# Patient Record
Sex: Male | Born: 1965
Health system: Southern US, Community
[De-identification: ages and names within clinical notes are randomized; demographics above are authoritative.]

## PROBLEM LIST (undated history)

## (undated) ENCOUNTER — Emergency Department: Payer: PRIVATE HEALTH INSURANCE

---

## 2012-10-19 LAB — HEMOGLOBIN A1C: Hemoglobin A1C: 6.3 % — ABNORMAL HIGH

## 2012-10-19 LAB — HEPATITIS C ANTIBODY: Hepatitis C Ab: NEGATIVE

## 2012-12-07 LAB — HM COLONOSCOPY

## 2014-09-01 LAB — LIPID PANEL
Chol/HDL Ratio: 2.7
Cholesterol: 172 mg/dL
HDL: 63 mg/dL
LDL Calculated: 92 mg/dL
Non HDL Cholesterol: 109 mg/dL
Triglycerides: 85 mg/dL

## 2016-04-26 ENCOUNTER — Other Ambulatory Visit: Payer: Self-pay | Admitting: Primary Care

## 2016-06-09 ENCOUNTER — Encounter: Payer: Self-pay | Admitting: Primary Care

## 2016-06-11 ENCOUNTER — Encounter: Payer: Self-pay | Admitting: Primary Care

## 2016-06-11 DIAGNOSIS — Z720 Tobacco use: Secondary | ICD-10-CM | POA: Insufficient documentation

## 2016-06-11 DIAGNOSIS — K900 Celiac disease: Secondary | ICD-10-CM

## 2016-06-11 DIAGNOSIS — E7800 Pure hypercholesterolemia, unspecified: Secondary | ICD-10-CM

## 2016-06-11 DIAGNOSIS — J45909 Unspecified asthma, uncomplicated: Secondary | ICD-10-CM | POA: Insufficient documentation

## 2016-06-11 DIAGNOSIS — J449 Chronic obstructive pulmonary disease, unspecified: Secondary | ICD-10-CM

## 2016-06-11 HISTORY — DX: Unspecified asthma, uncomplicated: J45.909

## 2016-06-11 HISTORY — DX: Celiac disease: K90.0

## 2016-06-11 HISTORY — DX: Pure hypercholesterolemia, unspecified: E78.00

## 2016-06-11 HISTORY — DX: Chronic obstructive pulmonary disease, unspecified: J44.9

## 2016-06-11 HISTORY — DX: Tobacco use: Z72.0

## 2016-09-03 ENCOUNTER — Ambulatory Visit (INDEPENDENT_AMBULATORY_CARE_PROVIDER_SITE_OTHER): Payer: Self-pay | Admitting: Family Medicine

## 2016-09-03 VITALS — BP 142/88 | HR 84 | Temp 97.9°F | Resp 18 | Ht 75.0 in | Wt 278.0 lb

## 2016-09-03 DIAGNOSIS — Z024 Encounter for examination for driving license: Secondary | ICD-10-CM

## 2016-09-03 NOTE — Patient Instructions (Addendum)
  Stop Allegra D as the decongestant may be increasing your blood pressure.Keep a record of your blood pressures outside of the office and if remaining over 140/90 - follow up with a primary care provider.   Avoid more than 1 alcoholic drink per day and if any cravings or times of drinking more - be seen by substance abuse specialist or discuss plan with primary care provider.  Do not drive if you have consumed ANY alcohol.   Recommend quitting smoking - call your primary provider for medication refill or follow up with local primary care provider.   Return to the clinic or go to the nearest emergency room if any of your symptoms worsen or new symptoms occur.    IF you received an x-ray today, you will receive an invoice from [facility]. Please contact [facility] at [phone] with questions or concerns regarding your invoice.   IF you received labwork today, you will receive an invoice from [facility]. Please contact [facility] at [phone] with questions or concerns regarding your invoice.   Our billing staff will not be able to assist you with questions regarding bills from these companies.  You will be contacted with the lab results as soon as they are available. The fastest way to get your results is to activate your My Chart account. Instructions are located on the last page of this paperwork. If you have not heard from us regarding the results in 2 weeks, please contact this office.

## 2016-09-03 NOTE — Progress Notes (Signed)
By signing my name below, I, [name], attest that this documentation has been prepared under the direction and in the presence of [name], MD.  Electronically Signed: [name], Medical Scribe. 09/03/16. 9:36 AM.  Subjective:    Patient ID: Wesley Edwards, male    DOB: 1965-10-22, 51 y.o.   MRN: [id number]  HPI Chief Complaint  Patient presents with  . Employment Physical    HPI Comments: Wesley Edwards is a 51 y.o. 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 No previous notes in system. Paper work reviewed, he has a hx of GERD, asthma, tobacco abuse, occasional alcohol.  Pt had his 2 year card before this visit.  Asthma: He takes symbicort, and albuterol PRN. Pt started taking allegra-D every morning in December for his dog allergies. Pt stays in a hotel and mentions it's dusty in the room - top of the counter had caked with dust. He no longer takes symbicort, and uses albuterol occasionally. No coughing fits causing dizziness. Denies chest pain, chest tightness, and SOB while driving.   GERD: He's in omeprezole. Pt would have GERD at night, but it's been controlled.  Vision: Pt does not wear rx glasses or contacts. He does wear cheaters to read, not to drive. Denies night time glare or trouble seeing from afar.  Tobacco Abuse: He smokes a pack a day and has thoughts of cessation - tried for a little over a year without success. When he tried chantix he quit for a year, but since everyone at his job smokes he began smoking again. Denies using cocaine, marijuana or other illicit drugs.  Alcohol Use: Pt was home for the holidays during his DWI -  He lives in [place] and will be here until December. Pt had his first and only a DWI 04/02/2015 when he was home on vacation. Pt did an impact panel for an 1.5 class, and another class in [place] for his DWI. Pt drinks occasionally and when he works he drinks 5 drinks a week - glass of wine  with dinner during the night. Pt tries not to drink while working since he's always on call when working. He had a few glasses of wine last night to celebrate the home coming of a friend, had 9-8 beers on Friday socially with his brother whose also a big drinker, and on special occasions. Denies problem/cravings with alcohol.  Denies history of sleep apnea, snoring or daytime somnolence.  Fractures: Pt had a fractured rib after falling on a log while hunting. 6-8 months later he tripped on a computer chair while walking in the dark.  There are no active problems to display for this patient.  History reviewed. No pertinent past medical history. History reviewed. No pertinent surgical history. Allergies  Allergen Reactions  . Sulfa Antibiotics Hives   Prior to Admission medications   Medication Sig Start Date End Date Taking? Authorizing Provider  budesonide-formoterol (SYMBICORT) 80-4.5 MCG/ACT inhaler Inhale 2 puffs into the lungs 2 (two) times daily.   Yes Historical Provider, MD   Social History   Social History  . Marital status: Single    Spouse name: N/A  . Number of children: N/A  . Years of education: N/A   Occupational History  . Not on file.   Social History Main Topics  . Smoking status: Current Every Day Smoker    Packs/day: 1.00    Types: Cigarettes  . Smokeless tobacco: Never Used  .  Alcohol use Yes  . Drug use: No  . Sexual activity: Not on file   Other Topics Concern  . Not on file   Social History Narrative  . No narrative on file   Review of Systems  Eyes: Negative for photophobia and visual disturbance.  Respiratory: Negative for cough, chest tightness and shortness of breath.   Cardiovascular: Negative for chest pain.  Neurological: Negative for dizziness and light-headedness.   Objective:  Physical Exam  Constitutional: He is oriented to person, place, and time. He appears well-developed and well-nourished.  HENT:  Head: Normocephalic and  atraumatic.  Right Ear: External ear normal.  Left Ear: External ear normal.  Mouth/Throat: Oropharynx is clear and moist.  Eyes: Conjunctivae and EOM are normal. Pupils are equal, round, and reactive to light.  Neck: Normal range of motion. Neck supple. No thyromegaly present.  Cardiovascular: Normal rate, regular rhythm, normal heart sounds and intact distal pulses.   Pulmonary/Chest: Effort normal and breath sounds normal. No respiratory distress. He has no wheezes.  Abdominal: Soft. He exhibits no distension. There is no tenderness. Hernia confirmed negative in the right inguinal area and confirmed negative in the left inguinal area.  Musculoskeletal: Normal range of motion. He exhibits no edema or tenderness.  Lymphadenopathy:    He has no cervical adenopathy.  Neurological: He is alert and oriented to person, place, and time. He has normal reflexes.  Skin: Skin is warm and dry.  Cool toes with socks off, but cap refill and sensation intact.   Psychiatric: He has a normal mood and affect. His behavior is normal.  Vitals reviewed.   Vitals:   09/03/16 0856 09/03/16 0921  BP: (!) 160/92 (!) 142/88  Pulse: 84   Resp: 18   Temp: 97.9 F (36.6 C)   TempSrc: Oral   SpO2: 95%   Weight: 278 lb (126.1 kg)   Height: 6\' 3"  (1.905 m)    Vision/Hearing:  Visual Acuity Screening   Right eye Left eye Both eyes  Without correction: 20/13 20/13 20/13   With correction:     Comments: Titmus test was 85 degrees in left eye and 70 degrees in right eye.  Hearing Screening Comments: WHISPER TEST WAS 10FT IN BOTH EARS.  Assessment & Plan:   Wesley HeroRobert Lie is a 51 y.o. male Encounter for commercial driver medical examination (CDME)  -One year card given for elevated blood pressure, but that may be related to decongestant use. Monitor blood pressure out of office and follow-up with primary care provider if remaining over 140/90.   -Also discussed avoid alcohol more than 1 drink per day. Denies  problem drinking. If any increased cravings or use, to meet with primary care provider or substance abuse counselor and not to drive. Should not drive if consumed any alcohol.   -Tobacco cessation discussed, and can follow-up with primary care provider or call his previous primary care provider for refill of Chantix if needed.  - see dot ppwk.  Meds ordered this encounter  Medications  . budesonide-formoterol (SYMBICORT) 80-4.5 MCG/ACT inhaler    Sig: Inhale 2 puffs into the lungs 2 (two) times daily.   Patient Instructions    Stop Allegra D as the decongestant may be increasing your blood pressure.Keep a record of your blood pressures outside of the office and if remaining over 140/90 - follow up with a primary care provider.   Avoid more than 1 alcoholic drink per day and if any cravings or times of drinking more -  be seen by substance abuse specialist or discuss plan with primary care provider.  Do not drive if you have consumed ANY alcohol.   Recommend quitting smoking - call your primary provider for medication refill or follow up with local primary care provider.   Return to the clinic or go to the nearest emergency room if any of your symptoms worsen or new symptoms occur.    IF you received an x-ray today, you will receive an invoice from [facility]. Please contact [facility] at [phone] with questions or concerns regarding your invoice.   IF you received labwork today, you will receive an invoice from [facility]. Please contact [facility] at [phone] with questions or concerns regarding your invoice.   Our billing staff will not be able to assist you with questions regarding bills from these companies.  You will be contacted with the lab results as soon as they are available. The fastest way to get your results is to activate your My Chart account. Instructions are located on the last page of this paperwork. If you have not heard from [place] regarding the  results in 2 weeks, please contact this office.      I personally performed the services described in this documentation, which was scribed in my presence. The recorded information has been reviewed and considered for accuracy and completeness, addended by me as needed, and agree with information above.  Signed,   [name], MD Primary Care at [facility].  09/03/16 10:04 AM

## 2016-10-03 ENCOUNTER — Ambulatory Visit: Payer: Self-pay | Admitting: Family Medicine

## 2017-03-25 ENCOUNTER — Ambulatory Visit: Payer: PRIVATE HEALTH INSURANCE | Attending: Primary Care | Admitting: Primary Care

## 2017-03-25 VITALS — BP 130/90 | HR 83 | Temp 97.7°F | Ht 76.0 in | Wt 261.0 lb

## 2017-03-25 DIAGNOSIS — L231 Allergic contact dermatitis due to adhesives: Secondary | ICD-10-CM

## 2017-03-25 DIAGNOSIS — T22222A Burn of second degree of left elbow, initial encounter: Secondary | ICD-10-CM

## 2017-03-25 MED ORDER — PREDNISONE 20 MG PO TABS *I*
20.0000 mg | ORAL_TABLET | Freq: Two times a day (BID) | ORAL | 0 refills | Status: DC
Start: 2017-03-25 — End: 2017-04-03

## 2017-03-25 MED ORDER — MUPIROCIN 2 % EX OINT *I*
TOPICAL_OINTMENT | Freq: Three times a day (TID) | CUTANEOUS | 1 refills | Status: DC
Start: 2017-03-25 — End: 2017-12-09

## 2017-03-25 NOTE — Progress Notes (Signed)
Kevin SailsRobert J Baldwin is a 51 y.o. male is here today for   Chief Complaint   Patient presents with    Burn     left arm from cookie sheet,   right leg from canned air       HPI: About a week ago he burned the left arm as above on a cookie sheet.  He also had sprayed a cleaner inadvertently on his right leg separate from that event.  Over the last days gotten a large itching rash around the area on the left antecubital fossa where the burn is.  He wondered if it could be infection.      ROS initially treated with dermoplasty spray and then only today did he use some Neosporin.  That is a known allergy on his list.    Patient Active Problem List   Diagnosis Code    Asthma without status asthmaticus J45.909    Tobacco user Z72.0    Pure hypercholesterolemia E78.00    Chronic obstructive lung disease J44.9    Celiac disease K90.0       Allergies   Allergen Reactions    Bactrim [Sulfamethoxazole-Trimethoprim] Hives    Polymyxin B Other (See Comments)     Reaction not specified        Current Outpatient Prescriptions   Medication    omeprazole (PRILOSEC) 20 MG capsule    aspirin 325 MG tablet    VENTOLIN HFA 108 (90 BASE) MCG/ACT inhaler    mupirocin (BACTROBAN) 2 % ointment    predniSONE (DELTASONE) 20 MG tablet    budesonide-formoterol (SYMBICORT) 80-4.5 MCG/ACT inhaler     No current facility-administered medications for this visit.          Vitals:    03/25/17 1613   BP: 130/90   Pulse: 83   Temp: 36.5 C (97.7 F)   Weight: 118.4 kg (261 lb)   Height: 1.93 m (6\' 4" )     Physical Exam  Looks like a contact dermatitis around his burn.  The burn is 4 cm by a centimeter left antecubital.  The right leg blistering is about 3 cm in diameter.  The area of somewhat firm superficial redness and blistering without tenderness in the left antecubital fossa is about 10 x 25 cm.  See photograph.          Assesment/Plan:   Contact dermatitis presumably from thermoplastic spray if not then Neosporin and a second and perhaps  even third-degree burn.  Recommended mupirocin ointment to both burns rather than to Reclast or Neosporin.  Consider antibiotic if the red areas become more painful or if he gets a fever.    No Follow-up on file.  Ronni RumbleANIEL L Chera Slivka, MD

## 2017-04-03 ENCOUNTER — Ambulatory Visit: Payer: BLUE CROSS/BLUE SHIELD | Attending: Family Medicine | Admitting: Family Medicine

## 2017-04-03 ENCOUNTER — Encounter: Payer: Self-pay | Admitting: Family Medicine

## 2017-04-03 VITALS — BP 142/90 | HR 80 | Temp 97.9°F | Ht 76.0 in | Wt 261.0 lb

## 2017-04-03 DIAGNOSIS — R21 Rash and other nonspecific skin eruption: Secondary | ICD-10-CM | POA: Insufficient documentation

## 2017-04-03 LAB — GRAM STAIN: Gram Stain: 0

## 2017-04-03 MED ORDER — DOXYCYCLINE HYCLATE 100 MG PO TABS *I*
100.0000 mg | ORAL_TABLET | Freq: Two times a day (BID) | ORAL | 0 refills | Status: AC
Start: 2017-04-03 — End: 2017-04-13

## 2017-04-03 NOTE — Progress Notes (Signed)
Kevin SailsRobert J Burgert is a 51 y.o. male is here today for   Chief Complaint   Patient presents with    Follow-up     left arm burn & med reaction        Medications reviewed today in Epic.     Allergies were reviewed and confirmed with patient.       No past medical history on file.     Current Outpatient Prescriptions on File Prior to Visit   Medication Sig Dispense Refill    omeprazole (PRILOSEC) 20 MG capsule Take 20 mg by mouth daily (before breakfast)      mupirocin (BACTROBAN) 2 % ointment Apply topically 3 times daily 30 g 1    aspirin 325 MG tablet Take 325 mg by mouth daily      budesonide-formoterol (SYMBICORT) 80-4.5 MCG/ACT inhaler Inhale 2 puffs into the lungs 2 times daily   Shake well before each use.      VENTOLIN HFA 108 (90 BASE) MCG/ACT inhaler INHALE TWO PUFFS BY MOUTH EVERY 4 HOURS AS NEEDED FOR WHEEZING AND DIFFICULTY BREATHING 1 Inhaler 0     No current facility-administered medications on file prior to visit.         Allergies   Allergen Reactions    Bactrim [Sulfamethoxazole-Trimethoprim] Hives    Polymyxin B Other (See Comments)     Reaction not specified          HPI: He is back with worsening of the rash he had on his left inner arm. The rash is now spreading up and down the arm from the elbow area where it started. Red bumps have appeared, which are what is spreading, the center of the rash is oozing yellow fluid constantly which dries and turns crusty and very sticky. Prednisone did not improve it at all, just kept him awake and eating all night. He's been putting bacitracin ointment on it and it feels good for a minute, and then goes right back to oozing and sticking and feeling irritated.       ROS    Vitals:    04/03/17 1216   BP: 142/90   Pulse: 80   Temp: 36.6 C (97.9 F)   Weight: 118.4 kg (261 lb)   Height: 1.93 m (6\' 4" )         Health Maintenance Due   Topic Date Due    DEPRESSION SCREEN YEARLY  10/13/1965    HIV TESTING OFFERED  10/08/1980    IMM-INFLUENZA (1) 03/22/2017        Physical Exam  He looks well, but the right arm is red from the deltoid nearly to the wrist and swollen, oozing yellow fluid and with red bumps spreading past the oozing red rash to the tip of the shoulder and down to the wrist.     Assesment/Plan:   I would seem the present treatment is making things worse. I swabbed the wound for culture, the original burn area appears to be well healed, with the surrounding tissue further inflamed. Doxycyline ordered while we await the cutlure results. Stop using the bacitracin ointment and just use a soak to help clear the drainage until it starts to clear up. This drainage could be infectious, so try not to leave it around. He will call if anything seems worse rather than better.       No Follow-up on file.

## 2017-04-05 LAB — AEROBIC CULTURE: Aerobic Culture: 0

## 2017-04-07 ENCOUNTER — Telehealth: Payer: Self-pay | Admitting: Primary Care

## 2017-04-07 DIAGNOSIS — L239 Allergic contact dermatitis, unspecified cause: Secondary | ICD-10-CM

## 2017-04-07 NOTE — Telephone Encounter (Signed)
Left message to call back  

## 2017-04-07 NOTE — Telephone Encounter (Signed)
-----   Message from Mia Creek, FNP sent at 04/05/2017 10:51 AM EDT -----  Please call to report that there were a few different kinds of bacteria on the culture, hopefully the rash is improving on the antibiotic.

## 2017-04-08 MED ORDER — BETAMETHASONE VALERATE 0.1 % EX CREA *I*
TOPICAL_CREAM | Freq: Two times a day (BID) | CUTANEOUS | 3 refills | Status: DC | PRN
Start: 2017-04-08 — End: 2017-12-09

## 2017-04-08 NOTE — Telephone Encounter (Signed)
He can use betamethasone on it.  Prescription sent.

## 2017-04-08 NOTE — Telephone Encounter (Signed)
Patient called and notified. He states the rash is drying up, it is still very red but the whole arm around the rash is white dry skin. It still is itchy and burning feeling. He is only taking the antibiotics no creams or anything else. Any suggestions?

## 2017-04-08 NOTE — Addendum Note (Signed)
Addended by: Ronni Rumble on: 04/08/2017 09:35 AM     Modules accepted: Orders

## 2017-04-08 NOTE — Telephone Encounter (Signed)
Detailed message left that script was sent to pharmacy and to call back if this does not help his symptoms.

## 2017-10-28 ENCOUNTER — Emergency Department (HOSPITAL_BASED_OUTPATIENT_CLINIC_OR_DEPARTMENT_OTHER)
Admission: EM | Admit: 2017-10-28 | Discharge: 2017-10-28 | Disposition: A | Discharge status: Home / Self Care | Payer: BLUE CROSS/BLUE SHIELD | Attending: Emergency Medicine | Admitting: Emergency Medicine

## 2017-10-28 ENCOUNTER — Emergency Department (HOSPITAL_BASED_OUTPATIENT_CLINIC_OR_DEPARTMENT_OTHER): Payer: BLUE CROSS/BLUE SHIELD

## 2017-10-28 ENCOUNTER — Other Ambulatory Visit: Payer: Self-pay

## 2017-10-28 ENCOUNTER — Encounter (HOSPITAL_BASED_OUTPATIENT_CLINIC_OR_DEPARTMENT_OTHER): Payer: Self-pay | Admitting: *Deleted

## 2017-10-28 DIAGNOSIS — H538 Other visual disturbances: Secondary | ICD-10-CM

## 2017-10-28 DIAGNOSIS — R42 Dizziness and giddiness: Secondary | ICD-10-CM | POA: Insufficient documentation

## 2017-10-28 DIAGNOSIS — R358 Other polyuria: Secondary | ICD-10-CM | POA: Diagnosis not present

## 2017-10-28 DIAGNOSIS — F17210 Nicotine dependence, cigarettes, uncomplicated: Secondary | ICD-10-CM | POA: Insufficient documentation

## 2017-10-28 DIAGNOSIS — R51 Headache: Secondary | ICD-10-CM | POA: Diagnosis not present

## 2017-10-28 DIAGNOSIS — R631 Polydipsia: Secondary | ICD-10-CM | POA: Diagnosis not present

## 2017-10-28 DIAGNOSIS — R739 Hyperglycemia, unspecified: Secondary | ICD-10-CM | POA: Insufficient documentation

## 2017-10-28 LAB — URINALYSIS, ROUTINE W REFLEX MICROSCOPIC
GLUCOSE, UA: 250 mg/dL — AB
HGB URINE DIPSTICK: NEGATIVE
Ketones, ur: NEGATIVE mg/dL
Nitrite: NEGATIVE
PH: 6.5 (ref 5.0–8.0)
Protein, ur: 30 mg/dL — AB
SPECIFIC GRAVITY, URINE: 1.02 (ref 1.005–1.030)

## 2017-10-28 LAB — URINALYSIS, MICROSCOPIC (REFLEX)

## 2017-10-28 LAB — COMPREHENSIVE METABOLIC PANEL
ALBUMIN: 4.2 g/dL (ref 3.5–5.0)
ALT: 112 U/L — ABNORMAL HIGH (ref 17–63)
ANION GAP: 10 (ref 5–15)
AST: 78 U/L — ABNORMAL HIGH (ref 15–41)
Alkaline Phosphatase: 93 U/L (ref 38–126)
BUN: 10 mg/dL (ref 6–20)
CO2: 22 mmol/L (ref 22–32)
Calcium: 9.3 mg/dL (ref 8.9–10.3)
Chloride: 102 mmol/L (ref 101–111)
Creatinine, Ser: 0.76 mg/dL (ref 0.61–1.24)
GFR calc non Af Amer: >60 mL/min (ref >60)
GLUCOSE: 208 mg/dL — AB (ref 65–99)
POTASSIUM: 4 mmol/L (ref 3.5–5.1)
SODIUM: 134 mmol/L — AB (ref 135–145)
TOTAL PROTEIN: 7.2 g/dL (ref 6.5–8.1)
Total Bilirubin: 0.7 mg/dL (ref 0.3–1.2)

## 2017-10-28 LAB — CBG MONITORING, ED: GLUCOSE-CAPILLARY: 226 mg/dL — AB (ref 65–99)

## 2017-10-28 LAB — CBC
HEMATOCRIT: 46.1 % (ref 39.0–52.0)
HEMOGLOBIN: 16.5 g/dL (ref 13.0–17.0)
MCH: 31.7 pg (ref 26.0–34.0)
MCHC: 35.8 g/dL (ref 30.0–36.0)
MCV: 88.5 fL (ref 78.0–100.0)
Platelets: 171 10*3/uL (ref 150–400)
RBC: 5.21 MIL/uL (ref 4.22–5.81)
RDW: 12.7 % (ref 11.5–15.5)
WBC: 7.5 10*3/uL (ref 4.0–10.5)

## 2017-10-28 MED ORDER — METFORMIN HCL 500 MG PO TABS
500.0000 mg | ORAL_TABLET | Freq: Once | ORAL | Status: AC
  Administered 2017-10-28: 500 mg via ORAL
  Filled 2017-10-28: qty 1

## 2017-10-28 MED ORDER — METFORMIN HCL 500 MG PO TABS
500.0000 mg | ORAL_TABLET | Freq: Two times a day (BID) | ORAL | 0 refills | Status: AC
Start: 2017-10-28 — End: ?

## 2017-10-28 MED ORDER — SODIUM CHLORIDE 0.9 % IV BOLUS
1000.0000 mL | Freq: Once | INTRAVENOUS | Status: AC
  Administered 2017-10-28: 1000 mL via INTRAVENOUS

## 2017-10-28 MED FILL — metFORMIN HCL 500 MG TABS: 500 | 30 days supply | Qty: 60 | Fill #0

## 2017-10-28 NOTE — ED Notes (Signed)
ED Provider at bedside. 

## 2017-10-28 NOTE — ED Provider Notes (Signed)
[facility] EMERGENCY DEPARTMENT Provider Note   CSN: [id number] Arrival date & time: 10/28/17  1209     History   Chief Complaint Chief Complaint  Patient presents with  . Hyperglycemia    HPI Wesley Edwards is a 52 y.o. male here presenting with dizziness, vision changes, increased thirst.  [place] been [place] here for the last several months.  He states that over the last 3-4 weeks, he has intermittent blurry vision and some posterior headaches.  He also has increased thirstiness as well as dizziness and increased urination.  Denies any fevers or chills or vomiting or abdominal pain.  Patient has no history of diabetes but used his friend's glucometer and his sugar was 430 prior to arrival.   The history is provided by the patient.    History reviewed. No pertinent past medical history.  There are no active problems to display for this patient.   History reviewed. No pertinent surgical history.      Home Medications    Prior to Admission medications   Medication Sig Start Date End Date Taking? Authorizing Provider  budesonide-formoterol (SYMBICORT) 80-4.5 MCG/ACT inhaler Inhale 2 puffs into the lungs 2 (two) times daily.    [provider]    Family History No family history on file.  Social History Social History   Tobacco Use  . Smoking status: Current Every Day Smoker    Packs/day: 1.00    Types: Cigarettes  . Smokeless tobacco: Never Used  Substance Use Topics  . Alcohol use: Yes  . Drug use: No     Allergies   Sulfa antibiotics   Review of Systems Review of Systems  Constitutional: Positive for fatigue.  Eyes: Positive for visual disturbance.  Endocrine: Positive for polydipsia and polyuria.  Neurological: Positive for dizziness and headaches.  All other systems reviewed and are negative.    Physical Exam Updated Vital Signs BP (!) 140/105 (BP Location: Left Arm)   Pulse 78    Temp 98.2 F (36.8 C) (Oral)   Resp 14   Ht 6\' 4"  (1.93 m)   Wt 120.7 kg (266 lb 1.5 oz)   SpO2 96%   BMI 32.39 kg/m   Physical Exam  Constitutional: He is oriented to person, place, and time. He appears well-developed and well-nourished.  HENT:  Head: Normocephalic.  Mouth/Throat: Oropharynx is clear and moist.  Eyes: Pupils are equal, round, and reactive to light. EOM are normal.  No obvious papilledema bilaterally   Neck: Normal range of motion.  Cardiovascular: Normal rate, regular rhythm and normal heart sounds.  Pulmonary/Chest: Effort normal and breath sounds normal. No stridor. No respiratory distress. He has no wheezes.  Abdominal: Soft. Bowel sounds are normal. He exhibits no distension. There is no tenderness.  Musculoskeletal: Normal range of motion.  Neurological: He is alert and oriented to person, place, and time. No cranial nerve deficit. Coordination normal.  Skin: Skin is warm.  Psychiatric: He has a normal mood and affect.  Nursing note and vitals reviewed.    ED Treatments / Results  Labs (all labs ordered are listed, but only abnormal results are displayed) Labs Reviewed  URINALYSIS, ROUTINE W REFLEX MICROSCOPIC - Abnormal; Notable for the following components:      Result Value   Glucose, UA 250 (*)    Bilirubin Urine SMALL (*)    Protein, ur 30 (*)    Leukocytes, UA TRACE (*)  All other components within normal limits  URINALYSIS, MICROSCOPIC (REFLEX) - Abnormal; Notable for the following components:   Bacteria, UA FEW (*)    Squamous Epithelial / LPF 0-5 (*)    All other components within normal limits  COMPREHENSIVE METABOLIC PANEL - Abnormal; Notable for the following components:   Sodium 134 (*)    Glucose, Bld 208 (*)    AST 78 (*)    ALT 112 (*)    All other components within normal limits  CBG MONITORING, ED - Abnormal; Notable for the following components:   Glucose-Capillary 226 (*)    All other components within normal limits    CBC  CBG MONITORING, ED    EKG None  Radiology Ct Head Wo Contrast  Result Date: 10/28/2017 CLINICAL DATA:  Fatigue and dizziness EXAM: CT HEAD WITHOUT CONTRAST TECHNIQUE: Contiguous axial images were obtained from the base of the skull through the vertex without intravenous contrast. COMPARISON:  None. FINDINGS: Brain: No mass lesion, intraparenchymal hemorrhage or extra-axial collection. No evidence of acute cortical infarct. Normal appearance of the brain parenchyma and extra axial spaces for age. Vascular: No hyperdense vessel or unexpected vascular calcification. Skull: Normal visualized skull base, calvarium and extracranial soft tissues. Sinuses/Orbits: No sinus fluid levels or advanced mucosal thickening. No mastoid effusion. Normal orbits. IMPRESSION: Normal head CT. Electronically Signed   By: Deatra Robinson M.D.   On: 10/28/2017 14:27    Procedures Procedures (including critical care time)  Medications Ordered in ED Medications  metFORMIN (GLUCOPHAGE) tablet 500 mg (has no administration in time range)  sodium chloride 0.9 % bolus 1,000 mL (1,000 mLs Intravenous New Bag/Given 10/28/17 1335)     Initial Impression / Assessment and Plan / ED Course  I have reviewed the triage vital signs and the nursing notes.  Pertinent labs & imaging results that were available during my care of the patient were reviewed by me and considered in my medical decision making (see chart for details).     Wesley Edwards is a 52 y.o. male here with hyperglycemia, blurry vision, increased thirst. Likely new onset type 2 diabetes. Posterior headache and blurry vision somewhat atypical so will get CT head to r/o underlying mass. Will get labs and hydrate and likely will need to start on metformin. No vomiting or fevers or suggest DKA.    2:32 PM CT head unremarkable. Glucose 208, nl AG. LFTs slightly elevated but no RUQ tenderness. Likely new onset type 2 diabetes. Will start on metformin 500 mg BID.  Will have him follow up with PCP back home or at [facility] center here.   Final Clinical Impressions(s) / ED Diagnoses   Final diagnoses:  None    ED Discharge Orders    None       Charlynne Pander, MD 10/28/17 1433

## 2017-10-28 NOTE — ED Triage Notes (Signed)
Blurred vision, thirst, fatigue, dizziness for a few weeks. A friend checked his glucose and it was 430. No hx of diabetes.

## 2017-10-28 NOTE — ED Notes (Signed)
Pt. Reports he has had some eye issues with vision changes and thirst.

## 2017-10-28 NOTE — Discharge Instructions (Addendum)
Take metformin twice daily.   You likely have new onset diabetes. Please see wellness center for follow up or see your doctor at home   Return to ER if you have worse blurry vision, headaches, vomiting, fever, abdominal pain.

## 2017-12-09 ENCOUNTER — Ambulatory Visit: Payer: BLUE CROSS/BLUE SHIELD | Attending: Family Medicine | Admitting: Family Medicine

## 2017-12-09 ENCOUNTER — Encounter: Payer: Self-pay | Admitting: Family Medicine

## 2017-12-09 ENCOUNTER — Telehealth: Payer: Self-pay | Admitting: Primary Care

## 2017-12-09 VITALS — BP 146/92 | HR 86 | Temp 97.9°F | Ht 76.25 in | Wt 267.1 lb

## 2017-12-09 DIAGNOSIS — E7800 Pure hypercholesterolemia, unspecified: Secondary | ICD-10-CM | POA: Insufficient documentation

## 2017-12-09 DIAGNOSIS — E119 Type 2 diabetes mellitus without complications: Secondary | ICD-10-CM

## 2017-12-09 DIAGNOSIS — Z0000 Encounter for general adult medical examination without abnormal findings: Secondary | ICD-10-CM | POA: Insufficient documentation

## 2017-12-09 DIAGNOSIS — I10 Essential (primary) hypertension: Secondary | ICD-10-CM

## 2017-12-09 DIAGNOSIS — R5383 Other fatigue: Secondary | ICD-10-CM | POA: Insufficient documentation

## 2017-12-09 DIAGNOSIS — R0683 Snoring: Secondary | ICD-10-CM | POA: Insufficient documentation

## 2017-12-09 HISTORY — DX: Essential (primary) hypertension: I10

## 2017-12-09 HISTORY — DX: Type 2 diabetes mellitus without complications: E11.9

## 2017-12-09 LAB — POCT URINALYSIS DIPSTICK
Bilirubin,Ur: NEGATIVE
Blood,UA POCT: NEGATIVE
Glucose,UA POCT: 100 mg/dL
Ketones,UA POCT: NEGATIVE mg/dL
Leuk Esterase,UA POCT: NEGATIVE
Lot #: 32222103
Nitrite,UA POCT: NEGATIVE
PH,UA POCT: 5 (ref 5–8)
Protein,UA POCT: NEGATIVE mg/dL
Specific gravity,UA POCT: 1.02 (ref 1.002–1.030)
Urobilinogen,UA: NORMAL mg/dL

## 2017-12-09 LAB — COMPREHENSIVE METABOLIC PANEL
ALT: 96 U/L — ABNORMAL HIGH (ref 0–50)
AST: 67 U/L — ABNORMAL HIGH (ref 0–50)
Albumin: 4.7 g/dL (ref 3.5–5.2)
Alk Phos: 98 U/L (ref 40–130)
Anion Gap: 14 (ref 7–16)
Bilirubin,Total: 0.5 mg/dL (ref 0.0–1.2)
CO2: 26 mmol/L (ref 20–28)
Calcium: 9.5 mg/dL (ref 8.6–10.2)
Chloride: 101 mmol/L (ref 96–108)
Creatinine: 0.8 mg/dL (ref 0.67–1.17)
GFR,Black: 120 *
GFR,Caucasian: 104 *
Glucose: 168 mg/dL — ABNORMAL HIGH (ref 60–99)
Lab: 9 mg/dL (ref 6–20)
Potassium: 5 mmol/L (ref 3.3–5.1)
Sodium: 141 mmol/L (ref 133–145)
Total Protein: 7.1 g/dL (ref 6.3–7.7)

## 2017-12-09 LAB — LIPID PANEL
Chol/HDL Ratio: 3.4
Cholesterol: 229 mg/dL — AB
HDL: 68 mg/dL
LDL Calculated: 138 mg/dL — AB
Non HDL Cholesterol: 161 mg/dL
Triglycerides: 115 mg/dL

## 2017-12-09 LAB — TSH: TSH: 0.94 u[IU]/mL (ref 0.27–4.20)

## 2017-12-09 MED ORDER — LISINOPRIL 10 MG PO TABS *I*
10.0000 mg | ORAL_TABLET | Freq: Every day | ORAL | 5 refills | Status: DC
Start: 2017-12-09 — End: 2018-07-23

## 2017-12-09 NOTE — Telephone Encounter (Signed)
Order, face sheet and office note printed and given to [facility].

## 2017-12-09 NOTE — Progress Notes (Signed)
Kevin Baldwin is a 52 y.o. male is here today for   Chief Complaint   Patient presents with    Annual Exam     blood work, physical       Medications were reviewed today in Epic.    Allergies were reviewed and confirmed with patient.       History reviewed. No pertinent past medical history.     Current Outpatient Prescriptions on File Prior to Visit   Medication Sig Dispense Refill    omeprazole (PRILOSEC) 20 MG capsule Take 20 mg by mouth daily (before breakfast)      aspirin 325 MG tablet Take 325 mg by mouth daily      VENTOLIN HFA 108 (90 BASE) MCG/ACT inhaler INHALE TWO PUFFS BY MOUTH EVERY 4 HOURS AS NEEDED FOR WHEEZING AND DIFFICULTY BREATHING 1 Inhaler 0    budesonide-formoterol (SYMBICORT) 80-4.5 MCG/ACT inhaler Inhale 2 puffs into the lungs 2 times daily   Shake well before each use.       No current facility-administered medications on file prior to visit.         Allergies   Allergen Reactions    Bactrim [Sulfamethoxazole-Trimethoprim] Hives    Polymyxin B Other (See Comments)     Reaction not specified          HPI:  He was working down in [employer] about 6 weeks ago, and got dizzy and blurred vision when on the job. He went to an ER down there and his blood sugar was found to be quite high, in the high 300s and even 400+. He was given metformin 500 mg to take twice a day with meals. He has been pretty good about taking that in the mornings, but has forgotten the evening dose a few times. He bought himself a glucose tester at [employer], and has been checking he BG in the mornings. He is running around 150-160 on average now. He admits to feeling very tired all the time, and his wife is concerned he has sleep apnea. He snores loudly and stops breathing in his sleep. He has a history of elevated cholesterol levels, but isn't taking any medicine for that. He denies any chest pains or shortness of breath, but he does have asthma and he smokes.     Family history is updated today, positive for DM,  heart disease, hypertension, and high cholesterol.     ROS As in HPI    Vitals:    12/09/17 0756   BP: (!) 146/92   Pulse: 86   Temp: 36.6 C (97.9 F)   Weight: 121.2 kg (267 lb 2 oz)   Height: 1.937 m (6' 4.25")         Health Maintenance Due   Topic Date Due    HIV TESTING OFFERED  10/08/1980    IMM-ZOSTER (1 of 2) 10/08/2017    COLON CANCER SCREENING 5 YEAR COLONOSCOPY  12/07/2017       Physical Exam  He appears well, but his BP was 166/117 when he first arrived. That did come down on serial BPs to 146/92 average. Other vital signs as above. BMI is 32.3. Lungs have some scattered wheezes and crackles that do clear with a deep breath and cough.  He is a smoker.  Heart RRR, no murmurs or extrasystoles. Abdomen is soft and without tenderness, organomegaly or masses.     Assesment/Plan:  1. Type 2 diabetes mellitus  - continue Metformin 500 mg bid with meals  -  EKG 12 lead  - Hemoglobin A1c  - Comprehensive metabolic panel  - Continue the metformin 500 mg twice a day, and consider switching to a long-acting form so that he is not missing the evening dose.  - Plan follow-up in 3 months    2. Pure hypercholesterolemia  - EKG 12 lead  - Lipid add Rfx to Drt LDL if Trig >400  - Comprehensive metabolic panel    3. Loud snoring  - Sleep Study; Future    4. Fatigue  - Sleep Study; Future  - TSH  - Testosterone, free, total    5. Hypertension  - lisinopril (PRINIVIL,ZESTRIL) 10 MG tablet; Take 1 tablet (10 mg total) by mouth daily  Dispense: 30 tablet; Refill: 5  - He has an upcoming appointment for a DOT physical and is aware that his blood pressure needs to be under control before he can pass the test.    Return in about 1 month (around 01/09/2018).

## 2017-12-10 LAB — HEMOGLOBIN A1C: Hemoglobin A1C: 9 % — ABNORMAL HIGH

## 2017-12-10 LAB — TESTOSTERONE, FREE, TOTAL
Testosterone,% Free: 1 %
Testosterone,Free: 73 pg/mL (ref 47–244)
Testosterone: 496 ng/dL (ref 193–740)

## 2017-12-10 LAB — SEX HORMONE BINDING GLOBULIN: Sex Hormone Binding Glob: 53 nmol/L (ref 10–80)

## 2017-12-10 NOTE — Telephone Encounter (Signed)
Lm to call Teresa Price [phone]

## 2017-12-11 NOTE — Telephone Encounter (Signed)
Patient called and said he would like Barb S to call him back on his cell# when you get a chance

## 2017-12-12 ENCOUNTER — Encounter: Payer: Self-pay | Admitting: Gastroenterology

## 2017-12-12 ENCOUNTER — Telehealth: Payer: Self-pay

## 2017-12-12 NOTE — Telephone Encounter (Signed)
-----   Message from Mia Creek, FNP sent at 12/12/2017  7:09 AM EDT -----  Please call to report labs results are not too bad, diabetes number is 9.0, but I suspect it will be even better next time. Stay on the metformin and watch the diet. Testosterone levels are good. Thyroid function is normal. Cholesterol will be better next time as well, but not too bad now. Liver enzymes slightly elevated, may be due to the alcohol, but he's cut that down already as well. Plan f/u in 3 months for more blood work.

## 2017-12-12 NOTE — Telephone Encounter (Signed)
He will get machine today

## 2017-12-12 NOTE — Telephone Encounter (Signed)
notified

## 2017-12-16 ENCOUNTER — Other Ambulatory Visit: Payer: Self-pay

## 2017-12-16 LAB — CBC
Baso # K/uL: 0.1 10*3/uL — ABNORMAL HIGH (ref 0.02–0.05)
Basophil %: 0.7 % (ref 0.0–2.0)
Eos # K/uL: 0.1 10*3/uL — ABNORMAL LOW (ref 0.15–0.70)
Eosinophil %: 0.9 % — ABNORMAL LOW (ref 1.0–7.0)
Hematocrit: 47.8 % (ref 41.0–49.0)
Hemoglobin: 16.3 g/dL (ref 14.0–16.7)
Lymph # K/uL: 0.8 10*3/uL — ABNORMAL LOW (ref 1.20–4.40)
Lymphocyte %: 9.4 % — ABNORMAL LOW (ref 20.0–45.0)
MCH: 31.9 pg/cell (ref 28.0–33.0)
MCHC: 34 g/dL (ref 31.0–35.0)
MCV: 93.7 fL (ref 80.0–95.0)
Mean Platelet Volume: 8.9 fL (ref 7.2–10.2)
Mono # K/uL: 0.2 10*3/uL (ref 0.11–0.80)
Monocyte %: 2.7 % (ref 1.0–10.0)
Neut # K/uL: 7 10*3/uL (ref 1.40–7.50)
Platelets: 181 10*3/uL (ref 130–400)
RBC: 5.1 M/uL (ref 4.60–6.10)
RDW: 13 % (ref 11.0–16.0)
Seg Neut %: 86.3 % — ABNORMAL HIGH (ref 40.0–75.0)
WBC: 8.1 10*3/uL (ref 4.8–10.8)

## 2017-12-16 LAB — COMPREHENSIVE METABOLIC PANEL
A/G RATIO: 1.3 (ref 1.1–2.3)
ALT: 84 IU/L — ABNORMAL HIGH (ref 10–60)
AST: 61 IU/L — ABNORMAL HIGH (ref 10–42)
Albumin: 4.3 g/dL (ref 3.5–5.0)
Alk Phos: 77 IU/L (ref 42–121)
Anion Gap: 12 mmol/L (ref 7–16)
Bilirubin,Total: 0.9 mg/dL (ref 0.2–1.0)
CO2: 22 mmol/L (ref 21–31)
Calcium: 9.3 mg/dL (ref 8.4–10.2)
Chloride: 103 mmol/L (ref 101–111)
Creatinine: 0.81 mg/dL (ref 0.61–1.24)
GFR,Black: 122 mL/min (ref 60–130)
GFR,Caucasian: 101 (ref 60–130)
Glucose: 196 mg/dL — ABNORMAL HIGH (ref 70–100)
Lab: 11 mg/dL (ref 6–20)
Osmolality: 278.6 mosm/L (ref 275–305)
Potassium: 4.3 mmol/L (ref 3.6–5.0)
Sodium: 137 mmol/L (ref 135–145)
Total Protein: 7.6 g/dL (ref 6.7–8.2)
UN/Creat Ratio: 13.6 (ref 12.0–20.0)

## 2017-12-16 NOTE — Telephone Encounter (Signed)
Snap machine returned. Paperwork sent to scanning. He has developed a golden crust sore areas under his nose and lower lip. Offered him an  appt today at 1:40 and 4pm , he dropped of machine at noon.He refused to wait. He will just go to ER

## 2017-12-17 ENCOUNTER — Telehealth: Payer: Self-pay | Admitting: Primary Care

## 2017-12-17 NOTE — Telephone Encounter (Signed)
FYI:  Patient has been admitted to [facility] with COPD Exacerbation.    His Immunization history was given to the ED Nurse, Corrie Dandy.

## 2017-12-18 ENCOUNTER — Other Ambulatory Visit: Payer: Self-pay | Admitting: Gastroenterology

## 2017-12-22 ENCOUNTER — Telehealth: Payer: Self-pay

## 2017-12-22 NOTE — Telephone Encounter (Signed)
Out of System Transitions Care Management Documentation:        Hospital Admission Date/Discharge Date: 12/16/2017-12/20/2017     Discharge Diagnosis at the time of discharge:  COPD Exacerbation, Pneumonia      [facility]      Discharge Disposition:  Home      Course of Hospital Stay: Presented to ED for evaluation on "allergic reaction to sleep study mask." Admitted to medical unit for IV antibiotics, steroids, neb treatments for Acute exacerbation of COPD and contact dermatitis. During hospitalization-desaturated with ambulation and pulmonary was consulted. Diagnosed with pneumonia and started on additional antibiotics. Stable for discharge with follow up with pulmonologist and PCP. Instructed to complete course of two antibiotics and increase metformin to 4 tablets per day.         Future Appointments: follow up with August AlbinoMary Richards, NP on 12/24/2017 at 6 pm /follow up with Pulmonologist as scheduled    Significant Med Changes: Started: Breo Ellipta, Bacitracin topical ointment, Culturelle Capsules, Levofloxacin, Incruse Ellipta, Cefdinir, prednisone, Klor-con  Increase metformin to 2 tabs twice daily; and Stop Symbicort    Discharge medications reviewed against outpatient MEDICAL RECORD NUMBERyes            Leitha BleakDiane L Omere Marti, RN  12/22/2017

## 2017-12-24 ENCOUNTER — Ambulatory Visit: Payer: BLUE CROSS/BLUE SHIELD | Attending: Family Medicine | Admitting: Family Medicine

## 2017-12-24 ENCOUNTER — Encounter: Payer: Self-pay | Admitting: Family Medicine

## 2017-12-24 VITALS — BP 136/84 | HR 76 | Temp 98.3°F | Resp 18 | Ht 76.25 in | Wt 258.6 lb

## 2017-12-24 DIAGNOSIS — Z0289 Encounter for other administrative examinations: Secondary | ICD-10-CM

## 2017-12-24 DIAGNOSIS — Z024 Encounter for examination for driving license: Secondary | ICD-10-CM

## 2017-12-24 LAB — POCT URINALYSIS DIPSTICK
Bilirubin,Ur: NEGATIVE
Blood,UA POCT: NEGATIVE
Glucose,UA POCT: 250 mg/dL
Ketones,UA POCT: NEGATIVE mg/dL
Leuk Esterase,UA POCT: NEGATIVE
Lot #: 32222103
Nitrite,UA POCT: NEGATIVE
PH,UA POCT: 5 (ref 5–8)
Protein,UA POCT: NEGATIVE mg/dL
Specific gravity,UA POCT: 1.01 (ref 1.002–1.030)
Urobilinogen,UA: NORMAL mg/dL

## 2017-12-24 NOTE — Telephone Encounter (Signed)
Kevin Baldwin called stated Kevin Baldwin had called her very concerned over the [facility]. She says it is not a latex  But a silicon product.  He did go to er for pneumonia and contact  Dermatitis.

## 2017-12-26 ENCOUNTER — Other Ambulatory Visit: Payer: Self-pay | Admitting: Gastroenterology

## 2017-12-26 NOTE — Progress Notes (Signed)
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  Recently discharged for COPD exacerbation and pneumonia.  Seeing pulmonology tomorrow.  Plans to also follow up with Raynelle FanningJulie (CCP)  Cleared for 1 year certification due to diabetes, hypertension and COPD.  It is encouraging that he has quit smoking.

## 2017-12-29 ENCOUNTER — Encounter: Payer: Self-pay | Admitting: Gastroenterology

## 2017-12-30 ENCOUNTER — Encounter: Payer: Self-pay | Admitting: Gastroenterology

## 2018-01-02 ENCOUNTER — Ambulatory Visit: Payer: BLUE CROSS/BLUE SHIELD | Attending: Family Medicine | Admitting: Family Medicine

## 2018-01-02 ENCOUNTER — Encounter: Payer: Self-pay | Admitting: Family Medicine

## 2018-01-02 VITALS — BP 120/78 | HR 111 | Temp 97.2°F | Wt 260.0 lb

## 2018-01-02 DIAGNOSIS — J449 Chronic obstructive pulmonary disease, unspecified: Secondary | ICD-10-CM

## 2018-01-02 DIAGNOSIS — Z1211 Encounter for screening for malignant neoplasm of colon: Secondary | ICD-10-CM

## 2018-01-06 NOTE — Progress Notes (Signed)
Kevin Baldwin is a 52 y.o. male is here today for   Chief Complaint   Patient presents with    Follow-up     [facility] d/c pneumonia       Medications were reviewed today in Epic.    Allergies were reviewed and confirmed with patient.       No past medical history on file.     Current Outpatient Prescriptions on File Prior to Visit   Medication Sig Dispense Refill    fluticasone furoate-vilanterol (BREO ELLIPTA) 100-25 mcg/inhalation inhaler Inhale 1 puff into the lungs daily      lactobacillus rhamnosus, GG, (CULTURELLE) capsule Take 1 capsule by mouth daily      umeclidinium bromide (INCRUSE ELLIPTA) 62.5 MCG/INH AEPB Inhale into the lungs      metFORMIN (GLUCOPHAGE) 500 MG tablet Take 1,000 mg by mouth 2 times daily     0    lisinopril (PRINIVIL,ZESTRIL) 10 MG tablet Take 1 tablet (10 mg total) by mouth daily 30 tablet 5    omeprazole (PRILOSEC) 20 MG capsule Take 20 mg by mouth daily (before breakfast)      VENTOLIN HFA 108 (90 BASE) MCG/ACT inhaler INHALE TWO PUFFS BY MOUTH EVERY 4 HOURS AS NEEDED FOR WHEEZING AND DIFFICULTY BREATHING 1 Inhaler 0     No current facility-administered medications on file prior to visit.         Allergies   Allergen Reactions    Bactrim [Sulfamethoxazole-Trimethoprim] Hives    Sulfa Antibiotics Hives    Polymyxin B Other (See Comments)     Reaction not specified          HPI:  He is here today for follow-up after being in the hospital with pneumonia and COPD.  He is feeling much better now and he has not smoked since he got out of the hospital.  He was also just recently found to be diabetic, so his blood sugar levels were quite high for an unknown period of time prior to that.    ROS he denies any shortness of breath, productive cough or feeling of weakness.  He has not had any fevers.  He has been on prednisone which he just finished yesterday.    Vitals:    01/02/18 1613   BP: 120/78   Pulse: (!) 111   Temp: 36.2 C (97.2 F)   Weight: 117.9 kg (260 lb)         Health  Maintenance Due   Topic Date Due    HIV TESTING OFFERED  10/08/1980    IMM-ZOSTER (1 of 2) 10/08/2017    COLON CANCER SCREENING 5 YEAR COLONOSCOPY  12/07/2017       Physical Exam  He appears well and is in good spirits today.  He is very happy that he was able to quit smoking.  He still thinks about it about 20 times a day, but he doesn't light up.  His color is pink.  Skin is warm and dry.  There is no lymphadenopathy in the neck region.  Lung sounds are clear, heart regular rate and rhythm.  Abdomen is soft, bowel sounds are present.  There is no ankle or pedal edema.     Assesment/Plan:  1. COPD (chronic obstructive pulmonary disease)  - Continue present medications, med list is updated to reflect discharge medications.    2. Screening for colon cancer  - AMB REFERRAL TO GASTROENTEROLOGY  _ He would like to go for colonoscopy  Return in about 1 month (around 02/01/2018) for Diabetes type 2.

## 2018-01-09 ENCOUNTER — Encounter: Payer: Self-pay | Admitting: Gastroenterology

## 2018-02-07 ENCOUNTER — Other Ambulatory Visit: Payer: Self-pay

## 2018-02-07 NOTE — Telephone Encounter (Signed)
Fax from pharmacy stating patient was in the hospital and they are asking for refills.

## 2018-02-09 ENCOUNTER — Encounter: Payer: Self-pay | Admitting: Gastroenterology

## 2018-02-09 LAB — HM COLONOSCOPY

## 2018-02-09 MED ORDER — UMECLIDINIUM BROMIDE 62.5 MCG/INH IN AEPB *I*
1.0000 | INHALATION_SPRAY | Freq: Every day | RESPIRATORY_TRACT | 5 refills | Status: DC
Start: 2018-02-09 — End: 2018-07-01

## 2018-02-09 MED ORDER — METFORMIN HCL 500 MG PO TABS *I*
1000.0000 mg | ORAL_TABLET | Freq: Two times a day (BID) | ORAL | 3 refills | Status: AC
Start: 2018-02-09 — End: ?

## 2018-02-09 MED ORDER — FLUTICASONE FUROATE-VILANTEROL 100-25 MCG/ACT IN AEPB *I*
1.0000 | INHALATION_SPRAY | Freq: Every day | RESPIRATORY_TRACT | 5 refills | Status: DC
Start: 2018-02-09 — End: 2018-07-01

## 2018-03-11 ENCOUNTER — Ambulatory Visit: Payer: BLUE CROSS/BLUE SHIELD | Admitting: Family Medicine

## 2018-03-11 ENCOUNTER — Encounter: Payer: Self-pay | Admitting: Family Medicine

## 2018-03-11 ENCOUNTER — Telehealth: Payer: Self-pay | Admitting: Family Medicine

## 2018-03-11 NOTE — Telephone Encounter (Signed)
Called and LM, pt missed his appt this morning with Raynelle FanningJulie. Asked pt to call if he would like to reschedule. 1st no show letter sent.

## 2018-04-22 ENCOUNTER — Encounter: Payer: Self-pay | Admitting: Primary Care

## 2018-05-04 ENCOUNTER — Telehealth: Payer: Self-pay | Admitting: Primary Care

## 2018-05-04 NOTE — Telephone Encounter (Signed)
Pt Stated that he was told by zerbe since he was put on different meds at his last visit he needed to to come in for a FUV and have labs done. Pt wanted to know If he needed to fast before coming in this Wednesday. Could you please advise thank you he is set up to see julie Wednesday 05/06/18 @ 9am.

## 2018-05-06 ENCOUNTER — Encounter: Payer: Self-pay | Admitting: Family Medicine

## 2018-05-06 ENCOUNTER — Ambulatory Visit: Payer: 59 | Attending: Family Medicine | Admitting: Family Medicine

## 2018-05-06 VITALS — BP 126/74 | HR 84 | Temp 96.2°F | Wt 244.5 lb

## 2018-05-06 DIAGNOSIS — I10 Essential (primary) hypertension: Secondary | ICD-10-CM | POA: Insufficient documentation

## 2018-05-06 DIAGNOSIS — R21 Rash and other nonspecific skin eruption: Secondary | ICD-10-CM | POA: Insufficient documentation

## 2018-05-06 DIAGNOSIS — E119 Type 2 diabetes mellitus without complications: Secondary | ICD-10-CM | POA: Insufficient documentation

## 2018-05-06 DIAGNOSIS — Z23 Encounter for immunization: Secondary | ICD-10-CM | POA: Insufficient documentation

## 2018-05-06 LAB — GRAM STAIN

## 2018-05-06 LAB — PCMH DEPRESSION ASSESSMENT

## 2018-05-06 MED ORDER — NYSTATIN 100000 UNIT/GM EX CREA *I*
TOPICAL_CREAM | Freq: Two times a day (BID) | CUTANEOUS | 1 refills | Status: DC
Start: 2018-05-06 — End: 2018-07-23

## 2018-05-06 NOTE — Progress Notes (Signed)
Kevin Baldwin is a 52 y.o. male is here today for   Chief Complaint   Patient presents with    Diabetes     3 month follow up     Other     rash on left arm, painful x 3 months       Medications were reviewed today in Epic.    Allergies were reviewed and confirmed with patient.       No past medical history on file.     Current Outpatient Medications on File Prior to Visit   Medication Sig Dispense Refill    fluticasone furoate-vilanterol (BREO ELLIPTA) 100-25 mcg/inhalation inhaler Inhale 1 puff into the lungs daily 1 Inhaler 5    umeclidinium bromide (INCRUSE ELLIPTA) 62.5 MCG/INH AEPB Inhale 1 puff into the lungs daily 1 Inhaler 5    metFORMIN (GLUCOPHAGE) 500 MG tablet Take 2 tablets (1,000 mg total) by mouth 2 times daily 360 tablet 3    lisinopril (PRINIVIL,ZESTRIL) 10 MG tablet Take 1 tablet (10 mg total) by mouth daily 30 tablet 5    omeprazole (PRILOSEC) 20 MG capsule Take 20 mg by mouth daily (before breakfast)      lactobacillus rhamnosus, GG, (CULTURELLE) capsule Take 1 capsule by mouth daily      VENTOLIN HFA 108 (90 BASE) MCG/ACT inhaler INHALE TWO PUFFS BY MOUTH EVERY 4 HOURS AS NEEDED FOR WHEEZING AND DIFFICULTY BREATHING (Patient not taking: Reported on 05/06/2018) 1 Inhaler 0     No current facility-administered medications on file prior to visit.         Allergies   Allergen Reactions    Bactrim [Sulfamethoxazole-Trimethoprim] Hives    Sulfa Antibiotics Hives    Polymyxin B Other (See Comments)     Reaction not specified          HPI:  He has had a bad rash on the left arm for months and finally he decided it wasn't going to go away by waiting for it. He has been wrapping it in a clean T-shirt at night because it gets all wet when he bends his arm and sleeps on it. He is also due for a HgbA1c to f/u on diabetes. He was diagnosed with severe sleep apnea, but he can't tolerate the CPAP mask due to a bad rash that won't clear up after using it just one time. He never picked up the  device. He did quit smoking, which he thinks has helped the sleep apnea.     ROS He denies any hypoglycemic symptoms and has tolerated the metformin without any specific stomach upset or diarrhea.    Vitals:    05/06/18 0900   BP: 126/74   Pulse: 84   Temp: 35.7 C (96.2 F)   Weight: 110.9 kg (244 lb 8 oz)         Health Maintenance Due   Topic Date Due    HIV TESTING OFFERED  10/08/1980    IMM-ZOSTER (1 of 2) 10/08/2017    IMM-INFLUENZA (1) 03/22/2018       Physical Exam  Left arm inside space is bright red, moist, with satellite lesions all around the edges of the bright red intertriginous area in the fold of the arm from the biceps to the mid forearm. He sleeps at hight with his arms folded up. I did swab it for culture as well. He has had it so long the skin is thinned and has fissures from scratching it. He doesn't have any rash  anywhere else. VS are stable as above.     Assesment/Plan:  1. Rash and other nonspecific skin eruption  Most likely yeast, but possibly secondary bacterial infection can't be ruled out.   - nystatin (MYCOSTATIN) 100000 UNIT/GM cream; Apply topically 2 times daily  Dispense: 30 g; Refill: 1  - Aerobic culture    2. Type 2 diabetes mellitus  - Hemoglobin A1c  -Continue metformin.     3. Immunization due  - Flu vaccine quadrivalent greater than or equal to 35mo preservative free IM    4. Hypertension, unspecified type  - Continue lisinopril.       No follow-ups on file.

## 2018-05-06 NOTE — Telephone Encounter (Signed)
Left message letting him know no fasting necessary.

## 2018-05-06 NOTE — Progress Notes (Signed)
Labs ordered during visit, I collected via right antecubital vein, tolerated well. 1 lavender sent to lab.LF

## 2018-05-07 ENCOUNTER — Telehealth: Payer: Self-pay | Admitting: Family Medicine

## 2018-05-07 LAB — HEMOGLOBIN A1C: Hemoglobin A1C: 7.3 % — ABNORMAL HIGH

## 2018-05-07 MED ORDER — DOXYCYCLINE HYCLATE 100 MG PO TABS *I*
100.0000 mg | ORAL_TABLET | Freq: Two times a day (BID) | ORAL | 0 refills | Status: DC
Start: 2018-05-07 — End: 2018-05-13

## 2018-05-07 NOTE — Telephone Encounter (Signed)
Patient notified as per Julie Grover, FNP

## 2018-05-07 NOTE — Telephone Encounter (Signed)
Left message to call office

## 2018-05-07 NOTE — Telephone Encounter (Signed)
-----   Message from Mia Creek, FNP sent at 05/07/2018  7:20 AM EDT -----  Please call to report labs results show bacteria that is likely staph, so I would like to go ahead and start him on doxycycline while we wait for full culture results. He should start that as soon as possible.

## 2018-05-07 NOTE — Telephone Encounter (Signed)
Notes recorded by Mia Creek, FNP on 05/07/2018 at 9:09 AM EDT  Please call to report labs results show HgbA1c down to 7.3, was 9.0 last visit. Goal is to be less than 7.0, so he's nearly there.  ------    Notes recorded by Mia Creek, FNP on 05/07/2018 at 7:20 AM EDT  Please call to report labs results show bacteria that is likely staph, so I would like to go ahead and start him on doxycycline while we wait for full culture results. He should start that as soon as possible.

## 2018-05-07 NOTE — Telephone Encounter (Signed)
Left message to call back  

## 2018-05-08 LAB — AEROBIC CULTURE

## 2018-05-08 NOTE — Telephone Encounter (Signed)
Left message with results

## 2018-05-10 ENCOUNTER — Encounter: Payer: Self-pay | Admitting: Family Medicine

## 2018-05-11 ENCOUNTER — Telehealth: Payer: Self-pay | Admitting: Primary Care

## 2018-05-11 DIAGNOSIS — R21 Rash and other nonspecific skin eruption: Secondary | ICD-10-CM

## 2018-05-11 MED ORDER — CEPHALEXIN 500 MG PO CAPS *I*
500.0000 mg | ORAL_CAPSULE | Freq: Three times a day (TID) | ORAL | 0 refills | Status: AC
Start: 2018-05-11 — End: 2018-05-18

## 2018-05-11 NOTE — Telephone Encounter (Signed)
Patient notified

## 2018-05-11 NOTE — Telephone Encounter (Addendum)
Kevin Baldwin called and stated that he has broken out in a rash in his armpits, neck, backs of his knees. He was prescribed an antibiotic on Thursday and believes it is from that. Kevin Baldwin was prescribed Doxycycline hyclate (VIBRA-TABS) 100 MG tablet. He was wondering if there was another antibiotic he could be started on. Please advise.    Leandra Kern LPN

## 2018-05-11 NOTE — Telephone Encounter (Signed)
Cephalexin sent.

## 2018-05-13 ENCOUNTER — Ambulatory Visit: Payer: 59 | Attending: Internal Medicine | Admitting: Internal Medicine

## 2018-05-13 VITALS — BP 124/88 | HR 89 | Temp 96.4°F | Wt 260.2 lb

## 2018-05-13 DIAGNOSIS — R21 Rash and other nonspecific skin eruption: Secondary | ICD-10-CM

## 2018-05-13 DIAGNOSIS — B957 Other staphylococcus as the cause of diseases classified elsewhere: Secondary | ICD-10-CM

## 2018-05-13 DIAGNOSIS — L302 Cutaneous autosensitization: Secondary | ICD-10-CM

## 2018-05-13 DIAGNOSIS — A498 Other bacterial infections of unspecified site: Secondary | ICD-10-CM

## 2018-05-13 MED ORDER — PREDNISONE 20 MG PO TABS *I*
ORAL_TABLET | ORAL | 0 refills | Status: DC
Start: 2018-05-13 — End: 2018-07-23

## 2018-05-13 MED ORDER — MUPIROCIN 2 % EX OINT *I*
TOPICAL_OINTMENT | Freq: Three times a day (TID) | CUTANEOUS | 0 refills | Status: AC
Start: 2018-05-13 — End: 2018-05-23

## 2018-05-19 ENCOUNTER — Encounter: Payer: Self-pay | Admitting: Internal Medicine

## 2018-05-19 NOTE — Progress Notes (Signed)
Kevin Baldwin is a 52 y.o. male is here today for   Chief Complaint   Patient presents with    Rash     spreading, 2nd medcation     Patient Active Problem List   Diagnosis Code    Asthma without status asthmaticus J45.909    Tobacco user Z72.0    Pure hypercholesterolemia E78.00    Chronic obstructive lung disease J44.9    Celiac disease K90.0    Type 2 diabetes mellitus E11.9    Hypertension I10     Allergies   Allergen Reactions    Bactrim [Sulfamethoxazole-Trimethoprim] Hives    Doxycycline Rash     Possible ID reaction with staph infection.    Polymyxin B Other (See Comments)     Reaction not specified      Current Outpatient Medications on File Prior to Visit   Medication Sig Dispense Refill    nystatin (MYCOSTATIN) 100000 UNIT/GM cream Apply topically 2 times daily 30 g 1    umeclidinium bromide (INCRUSE ELLIPTA) 62.5 MCG/INH AEPB Inhale 1 puff into the lungs daily 1 Inhaler 5    metFORMIN (GLUCOPHAGE) 500 MG tablet Take 2 tablets (1,000 mg total) by mouth 2 times daily 360 tablet 3    lisinopril (PRINIVIL,ZESTRIL) 10 MG tablet Take 1 tablet (10 mg total) by mouth daily 30 tablet 5    omeprazole (PRILOSEC) 20 MG capsule Take 20 mg by mouth daily (before breakfast)      fluticasone furoate-vilanterol (BREO ELLIPTA) 100-25 mcg/inhalation inhaler Inhale 1 puff into the lungs daily 1 Inhaler 5    lactobacillus rhamnosus, GG, (CULTURELLE) capsule Take 1 capsule by mouth daily      VENTOLIN HFA 108 (90 BASE) MCG/ACT inhaler INHALE TWO PUFFS BY MOUTH EVERY 4 HOURS AS NEEDED FOR WHEEZING AND DIFFICULTY BREATHING (Patient not taking: Reported on 05/06/2018) 1 Inhaler 0     No current facility-administered medications on file prior to visit.       HPI:  05/06/18 evaluated for rash on cubital fossa of left arm for months.  Treated for yeast and secondary bacterial infection, started on topical nystatin and doxycycline.  On 05/11/18 he called the office stating the rash has spread, flexural surfaces  of neck, axilla, cubital fossa, knees.  Itchy and burning, some clear drainage at night, not as bad as when he originally presented, the original rash is slowly improving.  No increased warmth or swelling.  Switched antibiotics to cephalexin.  Has not experienced any improvement.  Denies recent travel, change in soaps, lotions, detergents, and other environmental factors.  No known sick contacts.  Has not tried any other treatments.           Review of Systems   Constitutional: Negative for chills, diaphoresis and fever.   HENT: Negative for congestion and sore throat.    Respiratory: Negative for cough, shortness of breath and wheezing.    Cardiovascular: Negative for chest pain, palpitations and leg swelling.   Gastrointestinal: Negative for abdominal pain, constipation, diarrhea, nausea and vomiting.   Musculoskeletal: Negative for joint pain and myalgias.   Skin: Positive for itching and rash.   Neurological: Negative for dizziness, tingling, sensory change and headaches.     Vitals:    05/13/18 0925   BP: 124/88   Pulse: 89   Temp: 35.8 C (96.4 F)   Weight: 118 kg (260 lb 3.2 oz)     Physical Exam   Constitutional: He does not appear ill. No distress.  HENT:   Nose: No mucosal edema or rhinorrhea.   Mouth/Throat: Mucous membranes are normal. No oral lesions. Posterior oropharyngeal erythema (mild) present. No oropharyngeal exudate or posterior oropharyngeal edema.   Eyes: Pupils are equal, round, and reactive to light. Conjunctivae are normal.   Neck: Neck supple.   Cardiovascular: Normal rate and regular rhythm.   Pulmonary/Chest: Effort normal and breath sounds normal.   Lymphadenopathy:     He has no cervical adenopathy.   Neurological: He is alert.   Skin: Skin is warm and dry. Rash noted. Rash is maculopapular.   Darkly erythematous maculopapular rash, blanchable, widespread to almost all flexural surfaces, symmetric, appears similar to the original rash which is now somewhat improved.  Not really any  satellite lesions.  No increased warmth, swelling, drainage, weeping.  Non-tender.     Assessment/Plan:     1. Rash and other nonspecific skin eruption  - predniSONE (DELTASONE) 20 MG tablet; Take 3 tabs by mouth x1 day, then 1 tab twice daily x5 days, then 1 tab daily x3 days, then 1/2 tab daily x2 days  Dispense: 18 tablet; Refill: 0    2. Id reaction  - predniSONE (DELTASONE) 20 MG tablet; Take 3 tabs by mouth x1 day, then 1 tab twice daily x5 days, then 1 tab daily x3 days, then 1/2 tab daily x2 days  Dispense: 18 tablet; Refill: 0    3. Staphylococcus epidermidis infection  - mupirocin (BACTROBAN) 2 % ointment; Apply topically 3 times daily for 10 days  Dispense: 22 g; Refill: 0    Case discussed with Dr. Kimber Relic who suspects possible Id reaction vs drug reaction.  Will discontinue antibiotics and continue with topicals as well as treat with oral prednisone taper.  Doxycycline added to allergy list.    Return if symptoms worsen or fail to improve.    [address] [place], [place] 05/19/2018

## 2018-07-01 ENCOUNTER — Other Ambulatory Visit: Payer: Self-pay | Admitting: Primary Care

## 2018-07-01 MED ORDER — UMECLIDINIUM BROMIDE 62.5 MCG/INH IN AEPB *I*
1.0000 | INHALATION_SPRAY | Freq: Every day | RESPIRATORY_TRACT | 3 refills | Status: AC
Start: 2018-07-01 — End: 2019-02-17

## 2018-07-01 MED ORDER — FLUTICASONE FUROATE-VILANTEROL 100-25 MCG/ACT IN AEPB *I*
1.0000 | INHALATION_SPRAY | Freq: Every day | RESPIRATORY_TRACT | 3 refills | Status: AC
Start: 2018-07-01 — End: ?

## 2018-07-01 NOTE — Telephone Encounter (Signed)
Insurance requires 90 day supply. Orders pending.

## 2018-07-23 ENCOUNTER — Encounter: Payer: Self-pay | Admitting: Family Medicine

## 2018-07-23 ENCOUNTER — Ambulatory Visit: Payer: 59 | Attending: Family Medicine | Admitting: Family Medicine

## 2018-07-23 VITALS — BP 146/92 | HR 69 | Temp 98.4°F | Ht 76.25 in | Wt 267.0 lb

## 2018-07-23 DIAGNOSIS — E119 Type 2 diabetes mellitus without complications: Secondary | ICD-10-CM

## 2018-07-23 DIAGNOSIS — I10 Essential (primary) hypertension: Secondary | ICD-10-CM

## 2018-07-23 DIAGNOSIS — R21 Rash and other nonspecific skin eruption: Secondary | ICD-10-CM

## 2018-07-23 MED ORDER — LOSARTAN POTASSIUM 100 MG PO TABS *I*
100.0000 mg | ORAL_TABLET | Freq: Every day | ORAL | 5 refills | Status: AC
Start: 2018-07-23 — End: 2019-01-19

## 2018-07-23 MED ORDER — MUPIROCIN 2 % EX OINT *I*
TOPICAL_OINTMENT | Freq: Three times a day (TID) | CUTANEOUS | 0 refills | Status: AC
Start: 2018-07-23 — End: ?

## 2018-07-23 NOTE — Progress Notes (Signed)
Kevin Baldwin is a 53 y.o. male is here today for   Chief Complaint   Patient presents with    Rash     getting worse last weekend       Medications were reviewed today in Epic.    Allergies were reviewed and confirmed with patient.       Past Medical History:   Diagnosis Date    Asthma without status asthmaticus 06/11/2016    Celiac disease 06/11/2016    Chronic obstructive lung disease 06/11/2016    Hypertension 12/09/2017    Pure hypercholesterolemia 06/11/2016    Tobacco user 06/11/2016    Type 2 diabetes mellitus 12/09/2017        Current Outpatient Medications on File Prior to Visit   Medication Sig Dispense Refill    umeclidinium bromide (INCRUSE ELLIPTA) 62.5 MCG/INH AEPB Inhale 1 puff into the lungs daily 3 Inhaler 3    fluticasone furoate-vilanterol (BREO ELLIPTA) 100-25 mcg/inhalation inhaler Inhale 1 puff into the lungs daily 3 each 3    metFORMIN (GLUCOPHAGE) 500 MG tablet Take 2 tablets (1,000 mg total) by mouth 2 times daily 360 tablet 3    omeprazole (PRILOSEC) 20 MG capsule Take 20 mg by mouth daily (before breakfast)      VENTOLIN HFA 108 (90 BASE) MCG/ACT inhaler INHALE TWO PUFFS BY MOUTH EVERY 4 HOURS AS NEEDED FOR WHEEZING AND DIFFICULTY BREATHING (Patient not taking: Reported on 05/06/2018) 1 Inhaler 0     No current facility-administered medications on file prior to visit.         Allergies   Allergen Reactions    Bactrim [Sulfamethoxazole-Trimethoprim] Hives    Doxycycline Rash     Possible ID reaction with staph infection.    Polymyxin B Other (See Comments)     Reaction not specified          HPI: He is here today because the rash on his left arm has come back.  It did clear up last time with prednisone and Bactroban cream, and it really hasn't bothered him since he was here last.  Now it is very itchy and rashy looking with red spots and bumps.  It is starting on the right inner elbow area and he also has it behind both knees.  He had it on the back of his neck before, but  so far that hasn't bothered him.  He has been working out of state and is heading out tomorrow back to [place].      ROS    Vitals:    07/23/18 0903   BP: (!) 146/92   Pulse: 69   Temp: 36.9 C (98.4 F)   Weight: 121.1 kg (267 lb)   Height: 1.937 m (6' 4.25")         Health Maintenance Due   Topic Date Due    HIV TESTING OFFERED  10/08/1980    IMM-ZOSTER (1 of 2) 10/08/2017       Physical Exam  He appears well. He has the same rash back that had before on the left inner elbow extending up and down the left arm. The skin is thickened and plaque like in the elbow crease area, with bright red bumps and moisture now covering the plaque. The rash is very itchy, and is worse in the night. He is on an ACE for hypertension and diabetes, and his BP is not at goal today.     Assesment/Plan:  1. Rash and other nonspecific skin eruption  Rash may be related to the ACE inhibitor, by appears to have as secondary infection as it did before, likely from scratching in the night.   Prednisone and bactroban cream ordered, and stop taking the lisinopril.     2. Hypertension  Discontinue the lisinopril and start losartan 100 mg daily. Monitor BP at home to be sure he's at goal of under 140/90.     3. Type 2 diabetes mellitus  HgbA1c two months ago was 7.3, which was greatly improved over his last one that was over 9.0.   Continue present mediations and low carb diet.      No follow-ups on file.

## 2018-12-16 ENCOUNTER — Encounter: Payer: Self-pay | Admitting: Gastroenterology

## 2018-12-21 DIAGNOSIS — K760 Fatty (change of) liver, not elsewhere classified: Secondary | ICD-10-CM

## 2018-12-21 DIAGNOSIS — R7989 Other specified abnormal findings of blood chemistry: Secondary | ICD-10-CM

## 2018-12-22 ENCOUNTER — Other Ambulatory Visit: Payer: Self-pay | Admitting: Internal Medicine

## 2019-01-07 ENCOUNTER — Other Ambulatory Visit: Payer: Self-pay

## 2019-01-07 DIAGNOSIS — I83893 Varicose veins of bilateral lower extremities with other complications: Secondary | ICD-10-CM

## 2019-01-12 ENCOUNTER — Ambulatory Visit
Admission: RE | Admit: 2019-01-12 | Discharge: 2019-01-12 | Disposition: A | Discharge status: Home / Self Care | Payer: 59 | Source: Ambulatory Visit | Attending: Vascular Surgery | Admitting: Vascular Surgery

## 2019-01-12 ENCOUNTER — Ambulatory Visit: Payer: 59 | Admitting: Vascular Surgery

## 2019-01-12 ENCOUNTER — Encounter: Payer: Self-pay | Admitting: Vascular Surgery

## 2019-01-12 VITALS — BP 152/100 | Ht 76.0 in | Wt 267.0 lb

## 2019-01-12 DIAGNOSIS — I83893 Varicose veins of bilateral lower extremities with other complications: Secondary | ICD-10-CM | POA: Insufficient documentation

## 2019-01-12 LAB — CV VENOUS VARICOSE COMPETENCY US LOWER EXTREMITY BILATERAL
L SPJ VCT: 2.11 s
L SSV AP Diam Dist: 2.51 mm
L SSV AP Diam Mid: 3.19 mm
L SSV AP Diam Prox: 3 mm
Left Anterior Accessory Saphenous Vein Diameter A-P Distal: 2.51 mm
Left Anterior Accessory Saphenous Vein Diameter A-P Mid: 5.84 mm
Left Anterior Accessory Saphenous Vein Diameter A-P Prox: 5.65 mm
Left Anterior Saphenous Vein Closure Time Prox: 0.59 s
Left Common Femoral Vein Closure Time: 2.57 s
Left Femoral Vein Closure Time: 1.58 s
Left Great Saphenous Vein of Calf A-P Prox Diameter: 4.04 mm
Left Great Saphenous Vein of Calf Closure Time Prox: 0.56 s
Left Great Saphenous Vein of Knee A-P Diameter: 2.88 mm
Left Great Saphenous Vein of Knee Closure Time: 0.56 s
Left Great Saphenous Vein of Thigh A-P Diameter Dist: 3.18 mm
Left Great Saphenous Vein of Thigh A-P Diameter Mid-L: 1.84 mm
Left Great Saphenous Vein of Thigh A-P Diameter Prox: 5 mm
Left Great Saphenous Vein of Thigh Closure Time Prox: 2.36 s
Left Greater Saphenous Vein Ankle Diameter A-P: 3.67 mm
Left Popliteal Vein Closure TIme: 1.71 s
Left Saphenofemoral Junction A-P Diameter: 7.55 mm
Left Saphenofemoral Junction Closure Time: 2.11 s
Left Saphenopopliteal Junction Diameter: 2.96 mm
Left Saphenopopliteal Junction Diameter: 4.56 mm
R SSV AP Diam Dist: 1.9 mm
R SSV AP Diam Mid: 2.14 mm
R SSV AP Diam Prox: 2.26 mm
Right Anterior Accessory Saphenous Vein Diameter A-P Mid: 2.57 mm
Right Anterior Accessory Saphenous Vein Diameter A-P Prox: 2.81 mm
Right Common Femoral Vein Closure Time: 2.24 s
Right Femoral Vein Closure Time: 1.18 s
Right Great Saphenous Vein of Calf A-P Prox Diameter: 3.49 mm
Right Great Saphenous Vein of Calf Closure Time Prox: 1.07 s
Right Great Saphenous Vein of Knee A-P Diameter: 5.08 mm
Right Great Saphenous Vein of Knee Closure Time: 0.57 s
Right Great Saphenous Vein of Thigh A-P Diameter Dist: 4.9 mm
Right Great Saphenous Vein of Thigh A-P Diameter Mid-L: 6.48 mm
Right Great Saphenous Vein of Thigh A-P Diameter Prox: 10.83 mm
Right Great Saphenous Vein of Thigh Closure Time Prox: 0.67 s
Right Greater Saphenous Vein Ankle Diameter A-P: 2.69 mm
Right Popliteal Vein Closure Time: 0.55 s
Right Saphenofemoral Junction A-P Diameter: 10.53 mm
Right Saphenofemoral Junction Closure Time: 1.88 s
Right Saphenopopliteal Junction Diameter: 3.4 mm

## 2019-01-12 NOTE — Invasive Procedure Plan of Care (Signed)
[facility]  [facility]  [facility]    CONSENT FOR MEDICAL  OR SURGICAL PROCEDURE                            Patient Name: Kevin SailsRobert J Baldwin  [id number]                                                              DOB: December 21, 1965         Please read this form or have someone read it to you.   It's important to understand all parts of this form. If something isn't clear, ask us to explain.   When you sign it, that means you understand the form and give us permission to do this surgery or procedure.     I agree for Elsie StainEllis, Jennifer Lynn, MD , and other members of the Division of Vascular Surgery (Drs. Elwyn LadeStoner, Otilio ConnorsEllis, Doyle, Raman, Glocker, Mix) along with any assistants* they may choose, to treat the following condition(s): Symptomatic venous reflux and/or varicose veins in legs   By doing this surgery or procedure on me: Thermal treatment to affected vein in order to help improve your leg symptoms   This is also known as: Radiofrequency ablation (RFA) of anterior saphenous vein in left leg   Laterality: Left     *if you'd like a list of the assistants, please ask. We can give that to you.    1. The care provider has explained my condition to me. They have told me how the procedure can help me. They have told me about other ways of treating my condition. I understand the care provider cannot guarantee the result of the procedure. If I don't have this procedure, my other choices are: No procedure, continued conservative management of compression stockings and leg elevation, more invasive surgeries    2. The care provider has told me the risks (problems that can happen) of the procedure. I understand there may be unwanted results. The risks that are related to this procedure include: Excessive bleeding, infection, damage to nearby tissues, persistent or recurrent varicose veins, blood clots that may or may not require blood thinners to treat, ongoing pain, worsening leg swelling, leg  redness, leg open wounds, increased darkening of skin on leg, reactions to local anesthetic    3. I understand that during the procedure, my care provider may find a condition that we didn't know about before the treatment started. Therefore, I agree that my care provider can perform any other treatment which they think is necessary and available.    4. I understand the care provider may remove tissue, body parts, or materials during this procedure. These materials may be used to help with my diagnosis and treatment. They might also be used for teaching purposes or for research studies that I have separately agreed to participate in. Otherwise they will be disposed of as required by law.    5. My care provider might want a representative from a medical device company to be there during my procedure. I understand that person works for:          The ways they might help my care provider during my procedure include:  6. Here are my decisions about receiving blood, blood products, or tissues. I understand my decisions cover the time before, during and after my procedure, my treatment, and my time in the hospital. After my procedure, if my condition changes a lot, my care provider will talk with me again about receiving blood or blood products. At that time, my care provider might need me to review and sign another consent form, about getting or refusing blood.    I understand that the blood is from the community blood supply. Volunteers donated the blood, the volunteers were screened for health problems. The blood was examined with very sensitive and accurate tests to look for hepatitis, HIV/AIDS, and other diseases. Before I receive blood, it is tested again to make sure it is the correct type.    My chances of getting a sickness from blood products are small. But no transfusion is 100% safe. I understand that my care provider feels the good I will receive from the blood is greater than the chances of  something going wrong. My care provider has answered my questions about blood products.      My decision  about blood or  blood products   N/A        My decision   about tissue  Implants     N/A          I understand this  form.    My care provider  or his/her  assistants have  explained:   What I am having done and why I need it.  What other choices I can make instead of having this done.  The benefits and possible risks (problems) to me of having this done.  The benefits and possible risks (problems) to me of receiving transplants, blood, or blood products.  There is no guarantee of the results.  The care provider may not stay with me the entire time that I am in the operating or procedure room.  My provider has explained how this may affect my procedure. My provider has answered my  questions about this.         I give my  permission for  this surgery or  procedure.            _______________________________________________                                     My signature  (or parent or other person authorized to sign for you, if you are unable to sign for  yourself or if you are under 67 years old)        ______           Date        _____        Time   Electronic Signatures will display at the bottom of the consent form.    Care provider's statement: I have discussed the planned procedure, including the possibility for transfusion of blood  products or receipt of tissue as necessary; expected benefits; the possible complications and risks; and possible alternatives  and their benefits and risks with the patients or the patient's surrogate. In my opinion, the patient or the patient's surrogate  understands the proposed procedure, its risks, benefits and alternatives.              Electronically signed by: Carver Fila, NP  01/12/2019         Date        4:15 PM        Time

## 2019-01-12 NOTE — Invasive Procedure Plan of Care (Signed)
[facility]  [facility]  [facility]    CONSENT FOR MEDICAL  OR SURGICAL PROCEDURE                            Patient Name: Kevin Baldwin  [facility] [id number]                                                              DOB: 1966-04-25         Please read this form or have someone read it to you.   It's important to understand all parts of this form. If something isn't clear, ask us to explain.   When you sign it, that means you understand the form and give us permission to do this surgery or procedure.     I agree for Elsie StainEllis, Jennifer Lynn, MD , and other members of the Division of Vascular Surgery (Drs. Elwyn LadeStoner, Otilio ConnorsEllis, Doyle, Raman, Glocker, Mix) along with any assistants* they may choose, to treat the following condition(s): Symptomatic venous reflux and/or varicose veins in legs   By doing this surgery or procedure on me: Thermal treatment to affected vein in order to help improve your leg symptoms   This is also known as: Radiofrequency ablation (RFA) of great saphenous vein on left leg   Laterality: Left     *if you'd like a list of the assistants, please ask. We can give that to you.    1. The care provider has explained my condition to me. They have told me how the procedure can help me. They have told me about other ways of treating my condition. I understand the care provider cannot guarantee the result of the procedure. If I don't have this procedure, my other choices are: No procedure, continued conservative management of compression stockings and leg elevation, more invasive surgeries    2. The care provider has told me the risks (problems that can happen) of the procedure. I understand there may be unwanted results. The risks that are related to this procedure include: Excessive bleeding, infection, damage to nearby tissues, persistent or recurrent varicose veins, blood clots that may or may not require blood thinners to treat, ongoing pain, worsening leg swelling, leg redness,  leg open wounds, increased darkening of skin on leg, reactions to local anesthetic    3. I understand that during the procedure, my care provider may find a condition that we didn't know about before the treatment started. Therefore, I agree that my care provider can perform any other treatment which they think is necessary and available.    4. I understand the care provider may remove tissue, body parts, or materials during this procedure. These materials may be used to help with my diagnosis and treatment. They might also be used for teaching purposes or for research studies that I have separately agreed to participate in. Otherwise they will be disposed of as required by law.    5. My care provider might want a representative from a medical device company to be there during my procedure. I understand that person works for:          The ways they might help my care provider during my procedure include:  6. Here are my decisions about receiving blood, blood products, or tissues. I understand my decisions cover the time before, during and after my procedure, my treatment, and my time in the hospital. After my procedure, if my condition changes a lot, my care provider will talk with me again about receiving blood or blood products. At that time, my care provider might need me to review and sign another consent form, about getting or refusing blood.    I understand that the blood is from the community blood supply. Volunteers donated the blood, the volunteers were screened for health problems. The blood was examined with very sensitive and accurate tests to look for hepatitis, HIV/AIDS, and other diseases. Before I receive blood, it is tested again to make sure it is the correct type.    My chances of getting a sickness from blood products are small. But no transfusion is 100% safe. I understand that my care provider feels the good I will receive from the blood is greater than the chances of something going  wrong. My care provider has answered my questions about blood products.      My decision  about blood or  blood products   N/A        My decision   about tissue  Implants     N/A          I understand this  form.    My care provider  or his/her  assistants have  explained:   What I am having done and why I need it.  What other choices I can make instead of having this done.  The benefits and possible risks (problems) to me of having this done.  The benefits and possible risks (problems) to me of receiving transplants, blood, or blood products.  There is no guarantee of the results.  The care provider may not stay with me the entire time that I am in the operating or procedure room.  My provider has explained how this may affect my procedure. My provider has answered my  questions about this.         I give my  permission for  this surgery or  procedure.            _______________________________________________                                     My signature  (or parent or other person authorized to sign for you, if you are unable to sign for  yourself or if you are under 81 years old)        ______           Date        _____        Time   Electronic Signatures will display at the bottom of the consent form.    Care provider's statement: I have discussed the planned procedure, including the possibility for transfusion of blood  products or receipt of tissue as necessary; expected benefits; the possible complications and risks; and possible alternatives  and their benefits and risks with the patients or the patient's surrogate. In my opinion, the patient or the patient's surrogate  understands the proposed procedure, its risks, benefits and alternatives.              Electronically signed by: Carver Fila, NP  01/12/2019         Date        4:14 PM        Time

## 2019-01-12 NOTE — Invasive Procedure Plan of Care (Signed)
[facility]  [facility]  [facility]    CONSENT FOR MEDICAL  OR SURGICAL PROCEDURE                            Patient Name: Kevin SailsRobert J Groh  [facility] [id number] MR                                                              DOB: 11-13-1965         Please read this form or have someone read it to you.   It's important to understand all parts of this form. If something isn't clear, ask us to explain.   When you sign it, that means you understand the form and give us permission to do this surgery or procedure.     I agree for Elsie StainEllis, Jennifer Lynn, MD , and other members of the Division of Vascular Surgery (Drs. Elwyn LadeStoner, Otilio ConnorsEllis, Doyle, Raman, Glocker, Mix) along with any assistants* they may choose, to treat the following condition(s): Symptomatic venous reflux and/or varicose veins in legs   By doing this surgery or procedure on me: Thermal treatment to affected vein in order to help improve your leg symptoms   This is also known as: Radiofrequency ablation (RFA) of great saphenous vein on right leg   Laterality: Right     *if you'd like a list of the assistants, please ask. We can give that to you.    1. The care provider has explained my condition to me. They have told me how the procedure can help me. They have told me about other ways of treating my condition. I understand the care provider cannot guarantee the result of the procedure. If I don't have this procedure, my other choices are: No procedure, continued conservative management of compression stockings and leg elevation, more invasive surgeries    2. The care provider has told me the risks (problems that can happen) of the procedure. I understand there may be unwanted results. The risks that are related to this procedure include: Excessive bleeding, infection, damage to nearby tissues, persistent or recurrent varicose veins, blood clots that may or may not require blood thinners to treat, ongoing pain, worsening leg swelling, leg  redness, leg open wounds, increased darkening of skin on leg, reactions to local anesthetic    3. I understand that during the procedure, my care provider may find a condition that we didn't know about before the treatment started. Therefore, I agree that my care provider can perform any other treatment which they think is necessary and available.    4. I understand the care provider may remove tissue, body parts, or materials during this procedure. These materials may be used to help with my diagnosis and treatment. They might also be used for teaching purposes or for research studies that I have separately agreed to participate in. Otherwise they will be disposed of as required by law.    5. My care provider might want a representative from a medical device company to be there during my procedure. I understand that person works for:          The ways they might help my care provider during my procedure include:  6. Here are my decisions about receiving blood, blood products, or tissues. I understand my decisions cover the time before, during and after my procedure, my treatment, and my time in the hospital. After my procedure, if my condition changes a lot, my care provider will talk with me again about receiving blood or blood products. At that time, my care provider might need me to review and sign another consent form, about getting or refusing blood.    I understand that the blood is from the community blood supply. Volunteers donated the blood, the volunteers were screened for health problems. The blood was examined with very sensitive and accurate tests to look for hepatitis, HIV/AIDS, and other diseases. Before I receive blood, it is tested again to make sure it is the correct type.    My chances of getting a sickness from blood products are small. But no transfusion is 100% safe. I understand that my care provider feels the good I will receive from the blood is greater than the chances of  something going wrong. My care provider has answered my questions about blood products.      My decision  about blood or  blood products   N/A        My decision   about tissue  Implants     N/A          I understand this  form.    My care provider  or his/her  assistants have  explained:   What I am having done and why I need it.  What other choices I can make instead of having this done.  The benefits and possible risks (problems) to me of having this done.  The benefits and possible risks (problems) to me of receiving transplants, blood, or blood products.  There is no guarantee of the results.  The care provider may not stay with me the entire time that I am in the operating or procedure room.  My provider has explained how this may affect my procedure. My provider has answered my  questions about this.         I give my  permission for  this surgery or  procedure.            _______________________________________________                                     My signature  (or parent or other person authorized to sign for you, if you are unable to sign for  yourself or if you are under 67 years old)        ______           Date        _____        Time   Electronic Signatures will display at the bottom of the consent form.    Care provider's statement: I have discussed the planned procedure, including the possibility for transfusion of blood  products or receipt of tissue as necessary; expected benefits; the possible complications and risks; and possible alternatives  and their benefits and risks with the patients or the patient's surrogate. In my opinion, the patient or the patient's surrogate  understands the proposed procedure, its risks, benefits and alternatives.              Electronically signed by: Carver Fila, NP  01/12/2019         Date        3:49 PM        Time

## 2019-01-12 NOTE — Progress Notes (Signed)
Thank you for referring Kevin SailsRobert J Baldwin to the [facility]. Kevin MaduroRobert is a pleasant 53 y.o. 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       Subjective:     Kevin Baldwin's legs are tired, fatigued, and aching throughout the afternoon.  Elevation, rest, anti-inflammatory medication, avoidance of sitting or standing for greater than 30 minutes at a time, avoidance of leg crossing and exercise have been attempted in the past without success.  Compression therapy has been used for >3 month(s) without successful improvement in symptoms. Kevin MaduroRobert has no preceding history of edema, trauma, or hypercoagulable state.  History of superficial phlebitis: right .  History of DVT: no.  Prior varicose vein treatment on the RIGHT leg: Yes, surgery..  Prior varicose vein treatment on the LEFT leg: No.      Current anticoagulant: None  Number of pregnancies: N/A- male  Family history of vein disease: unknown    Patient Reported Symptoms:  RIGHT leg:  Heaviness: None of the time  Achiness: Some of the time  Swelling: Some of the time  Throbbing: A little of the time  Itching: A good bit of the time  Vein appearance: Extremely noticeable  Impact of RIGHT leg symptoms on ability to work/perform usual daily activities over the past week: Symptoms but full work/activity    LEFT leg:  Heaviness: None of the time  Achiness: Some of the time  Swelling: Some of the time  Throbbing: A little of the time  Itching: A good bit of the time  Vein appearance: Extremely noticeable  Impact of LEFT leg symptoms on ability to work/perform usual daily activities over the past week: Symptoms but full work/activity    Number of days after procedure before patient could resume work/normal activity: N/A - no procedure performed at this time    Review of Systems:  Pertinent items are noted in HPI.    Past Medical History:   Diagnosis Date    Asthma without status asthmaticus 06/11/2016     Celiac disease 06/11/2016    Chronic obstructive lung disease 06/11/2016    Hypertension 12/09/2017    Pure hypercholesterolemia 06/11/2016    Tobacco user 06/11/2016    Type 2 diabetes mellitus 12/09/2017      Past Surgical History:   Procedure Laterality Date    VARICOSE VEIN SURGERY Bilateral 2006      Family History   Problem Relation Age of Onset    Diabetes Mother     High Blood Pressure Father     Heart Disease Father     High cholesterol Father      Social History     Tobacco Use    Smoking status: Former Smoker     Types: Cigarettes     Last attempt to quit: 12/13/2017     Years since quitting: 1.0    Smokeless tobacco: Never Used   Substance Use Topics    Alcohol use: Yes     Allergies   Allergen Reactions    Bactrim [Sulfamethoxazole-Trimethoprim] Hives    Doxycycline Rash     Possible ID reaction with staph infection.    Polymyxin B Other (See Comments)     Reaction not specified       Current Outpatient Medications   Medication    umeclidinium bromide (INCRUSE ELLIPTA) 62.5 MCG/INH AEPB    fluticasone furoate-vilanterol (BREO ELLIPTA) 100-25 mcg/inhalation inhaler    metFORMIN (GLUCOPHAGE) 500 MG tablet  omeprazole (PRILOSEC) 20 MG capsule    losartan (COZAAR) 100 MG tablet    mupirocin (BACTROBAN) 2 % ointment    VENTOLIN HFA 108 (90 BASE) MCG/ACT inhaler     No current facility-administered medications for this visit.        Objective:     Physical Exam:  General appearance: alert, appears stated age, cooperative and no distress  Head: Normocephalic, without obvious abnormality, atraumatic  Heart: regular rate and rhythm  Lungs: respirations unlabored  Extremities: extremities normal, atraumatic, no cyanosis or edema, no redness or tenderness in the calves or thighs, no ulcers, gangrene, or trophic changes. RLE with ballotable varicosities to the medial thigh and calf and scattered through posterior calf.  LLE with ballotable varicosities to the medial thigh and calf and  scattered through the anterior and posterior calf.  Neurologic: Grossly normal    Vitals:   Vitals:    01/12/19 1518   BP: (!) 152/100   Weight: 121.1 kg (267 lb)   Height: 1.93 m (6\' 4" )     Pulses:  Right Dorsalis Pedis: Normal  Left Dorsalis Pedis: Normal  Right Posterior Tibial: Normal  Left Posterior Tibial: Normal     CEAP/VCSS:  RIGHT Leg CEAP/VCSS:  Class: C4a  Etiology: Primary  Anatomy: Superficial  Pathophysiology: Reflux    Pain: Moderate (interferes with, but does not prevent regular activity)  Varicose Veins: Severe (multiple, involve both calf and thigh)  Edema: None  Pigmentation: Mild (limited to perimalleolar area)  Inflammation: None  Induration: None  Active Ulcers: None  Ulcer Duration: None  Largest Diameter: None  Compression Rx: Most Days  Total: 8    LEFT Leg CEAP/VCSS:  Class: C4a  Etiology: Primary  Anatomy: Superficial  Pathophysiology: Reflux    Pain: Moderate (interferes with, but does not prevent regular activity)  Varicose Veins: Severe (multiple, involve both calf and thigh)  Edema: None  Pigmentation: Mild (limited to perimalleolar area)  Inflammation: None  Induration: None  Active Ulcers: None  Ulcer Duration: None  Largest Diameter: None  Compression Rx: Most Days  Total: 8    Non-invasive Studies:  RIGHT leg:   Duplex ultrasound    Reflux: GSV thigh, GSV calf and deep veins  Thrombus: SSV    LEFT leg:  Duplex ultrasound    Reflux: GSV thigh, GSV calf, AAGSV thigh and deep veins  Thrombus: none    Assessment:     Kevin Baldwin is a 53 y.o.-year old with symptomatic varicose veins involving the bilateral lower extremities secondary to deep and superficial reflux.     Plan:     Radiofrequency ablation.     Given Kevin Baldwin persistent symptoms despite conservative therapy (including rest, elevation, cool compresses, anti-inflammatory medication, and compression stocking therapy), RFA of the symptomatic veins is indicated. I discussed with Kevin Baldwin the risks, benefits, and alternatives  to RFA of the symptomatic varicose veins, including but not limited to, bleeding, infection, DVT, and continued medical management. Kevin Baldwin acknowledged an understanding of these and agreed to proceed. We will schedule this in the coming weeks.     Carver Fila, NP 01/12/2019

## 2019-02-17 ENCOUNTER — Encounter: Payer: Self-pay | Admitting: Vascular Surgery

## 2019-02-17 ENCOUNTER — Ambulatory Visit: Payer: PRIVATE HEALTH INSURANCE | Attending: Vascular Surgery | Admitting: Vascular Surgery

## 2019-02-17 VITALS — BP 138/84 | Temp 98.1°F | Ht 76.0 in | Wt 264.0 lb

## 2019-02-17 DIAGNOSIS — I83899 Varicose veins of unspecified lower extremities with other complications: Secondary | ICD-10-CM

## 2019-02-17 DIAGNOSIS — I83813 Varicose veins of bilateral lower extremities with pain: Secondary | ICD-10-CM | POA: Insufficient documentation

## 2019-02-17 DIAGNOSIS — I83893 Varicose veins of bilateral lower extremities with other complications: Secondary | ICD-10-CM

## 2019-02-17 MED ORDER — TUMESCENT ANESTHESIA SOLUTION *I*
1000.0000 mL | Freq: Once | INTRAVENOUS | Status: AC
Start: 2019-02-17 — End: 2019-02-18

## 2019-02-17 MED ORDER — LIDOCAINE HCL 1 % IJ SOLN *I*
10.0000 mL | Freq: Once | INTRAMUSCULAR | Status: AC
Start: 2019-02-17 — End: 2019-02-18

## 2019-02-17 NOTE — Patient Instructions (Signed)
[facility]  [address]  [place], [place]  [address]  [phone]    After procedure instructions - Saphenous vein closure procedure      Activity:  You may return to normal activities the day of your procedure.  Strenuous exercise can be restarted the next day.  Walking is recommended, bed rest is not.  No tub bathing, hot tubs or swimming for 24 hours.  Showers are fine.   A driver to and from your procedure is mandatory.      Travel:  Car trips (>3-4 hrs) and/or air flights are not recommended for 7-10 days post procedure.    Dressings/Bandages/Stockings:  Please wear your stocking for the first 24 hours.  The first night is the only night you will need to wear them to bed.  You may remove them and shower the next day.  After the first 24 hours, your stocking should be worn during day-light hours (7am-7pm) for 7 days.  Stockings should be worn consistently throughout the first week following your procedure to help maintain the vein closure and minimize swelling/bruising.  • The clear dressing & gauze pad can be removed in 2 days.  Any band aids can be removed the next day when you shower.  • Expect to see tiny droplets of the local anesthetic on your leg.  It could moisten your leg/stocking(s) and make it feel damp over the course of 24 hours, this is normal.  You may want to bring/wear comfortable and loose fitting pants on your procedure day.    Pain control:  Ibuprofen is recommended if needed for pain relief for the first 24-48 hours.  Most patients experience no significant pain post-operatively.    Important notes:  After your procedure we will make an appointment with in 5-7 days for a follow-up ultrasound at our office.  3 months after your procedure you will follow-up with the doctor, nurse practitioner, or physician assistant for an office visit.    Problems:  Please contact the office at [phone], if you have any questions or problems.      Additional information  regarding this procedure can be found on the web at [email or web address]

## 2019-02-17 NOTE — Progress Notes (Signed)
Surgeon: Glory Rosebush, MD  Assistant:   Surgery Date: 02/17/2019  [facility]    Preoperative Diagnosis: Symptomatic reflux of BILATERAL leg(I83.893).    Postoperative Diagnosis: Symptomatic reflux of BILATERAL leg(I83.893).    Procedure: RFA of LEFT great saphenous vein           Procedure start time:9:25           Procedure end time:9:46      Anesthesia: Tumescent: 468ml             Local: Lidocaine 1%:64ml             Additional Anesthesia: None      Indications: Kevin Baldwin is a 53 y.o. male who presented with signs and symptoms of bilateral lower extremity venous insufficiency. Venous competency duplex confirmed the diagnosis.  Symptoms of pain, swelling, and fatigue as well as progressive lipodermatosclerosis and cellulitis failed to improve despite conservative measures, therefore further treatment was indicated. The patient was informed of the potential risks, complications, benefits and alternatives to the procedure including but not limited to bleeding, infection, DVT, and continued medical management. The patient acknowledged understanding and informed consent was obtained.    Procedure: The patient was placed on the procedure table in a supine position.  The left lower extremity was prepped and draped in the usual sterile fashion.  A 2 patient identifier time out was performed. Under direct ultrasound guidance, the left great saphenous vein was accessed with an arterial entry needle at the level of the calf.  A wire was advanced without difficulty.  A small incision was made and the needle was exchanged out for a 62F sheath. The radiofrequency ablation probe was advanced to the level of the sapheno-femoral  junction and was placed 4 cm from the epigastric. Tumescent solution was injected along the length of the vein under ultrasound guidance. The probe was activated and the vein was ablated. Sterile dressing was placed with Steri-strips, guaze and tegederm. The patient tolerated the  procedure well. Post-procedure ultrasound documented patent common femoral and epigastric veins.    All sponge and instrument counts were correct at the end of the case.  I, Obrien Huskins, MD , was present for the entirety of the procedure.    Vascular Quality Initiative - Varicose Vein Treatment    Does this vein treatment meet VQI inclusion requirements?      Mark YES below f this was a percutaneous (closed) or cut-down (open) procedure to ablate or remove superficial truncal veins, perforating veins, or varicose vein clusters in the lower extremity(ies) for treatment of C2 or greater venous disease.    Mark NO" below if the treatment was performed for deep veins of the lower extremity, for trauma, or for C0 or C1 venous disease.  Also mark "NO" if treatment was performed for Klippel-Trenaunay Syndrome or other venous malformations.  .   Yes     Setting: hospital, outpatient  Anesthesia: local and tumescent: 475 ml  Peri-procedural anticoagulation: none  _____________________________________________    Vein Number:  1    Side: left  Location: Truncal: GSV thigh and calf  Largest Diameter: 6 mm  Ablation Treatment: Thermal, RF. Covidien- Closurefast (Length of vein treated: 91 cm.  Tip Length: 7 cm.  Number of cycles: 13. Total time: 260 seconds.)     Postop    Compression Therapy: Stocking.  Duration prescribed: seven days. Strength: 20-30 mmHg.   Complications: none

## 2019-02-22 ENCOUNTER — Encounter: Payer: Self-pay | Admitting: Vascular Surgery

## 2019-02-22 ENCOUNTER — Ambulatory Visit
Admission: RE | Admit: 2019-02-22 | Discharge: 2019-02-22 | Disposition: A | Discharge status: Home / Self Care | Payer: 59 | Source: Ambulatory Visit | Attending: Vascular Surgery | Admitting: Vascular Surgery

## 2019-02-22 ENCOUNTER — Ambulatory Visit: Payer: 59 | Admitting: Vascular Surgery

## 2019-02-22 VITALS — BP 152/80 | Ht 76.0 in | Wt 264.0 lb

## 2019-02-22 DIAGNOSIS — Z48812 Encounter for surgical aftercare following surgery on the circulatory system: Secondary | ICD-10-CM | POA: Insufficient documentation

## 2019-02-22 DIAGNOSIS — I83892 Varicose veins of left lower extremities with other complications: Secondary | ICD-10-CM | POA: Insufficient documentation

## 2019-02-22 DIAGNOSIS — I83813 Varicose veins of bilateral lower extremities with pain: Secondary | ICD-10-CM | POA: Insufficient documentation

## 2019-02-22 DIAGNOSIS — I83811 Varicose veins of right lower extremities with pain: Secondary | ICD-10-CM | POA: Insufficient documentation

## 2019-02-22 NOTE — Progress Notes (Signed)
I had the pleasure of seeing Kevin Baldwin in the [facility] today. Kevin Baldwin is a pleasant 53 y.o.-year-old male with a history of symptomatic venous reflux involving the left lower extremity(ies). On 02/17/2019, [name] underwent radiofrequency ablation of the left GSV vein(s), which was performed by Dr. Ralph Leydenoyle. Kevin Baldwin presents today for routine follow-up.    Subjective:     Since the procedure, Kevin Baldwin reports experiencing much improvement in prior symptoms of venous reflux. He denies any pain, redness or numbness in the left thigh or calf.  Overall, Kevin Baldwin is pleased with the outcome of the procedure.    Kevin Baldwin denies any changes to health history, medications, or drug allergies since the last visit.    RIGHT leg complications from procedure: N/A- no treatment performed to date on this leg  LEFT leg complications from procedure: none  Treated vein recanalyzed?: No  Hospital admission within 72 hours after outpatient procedure?: no  Current living status:  home  Current anticoagulant: none    Patient Reported Symptoms:    RIGHT leg:  Heaviness: A little of the time  Achiness: None of the time  Swelling: A little of the time  Throbbing: Some of the time  Itching: Some of the time  Vein appearance: Slightly noticeable  Impact of RIGHT leg symptoms on ability to work/perform usual daily activities over the past week: No effect    LEFT leg:  Heaviness: A little of the time  Achiness: None of the time  Swelling: A little of the time  Throbbing: Some of the time  Itching: Some of the time  Vein appearance: Slightly noticeable  Impact of LEFT leg symptoms on ability to work/perform usual daily activities over the past week: No effect         Review of Systems    Pertinent items are noted in HPI.    Past Medical History:   Diagnosis Date    Asthma without status asthmaticus 06/11/2016    Celiac disease 06/11/2016    Chronic obstructive lung disease 06/11/2016    Hypertension 12/09/2017    Pure  hypercholesterolemia 06/11/2016    Tobacco user 06/11/2016    Type 2 diabetes mellitus 12/09/2017      Past Surgical History:   Procedure Laterality Date    VARICOSE VEIN SURGERY Bilateral 2006      Family History   Problem Relation Age of Onset    Diabetes Mother     High Blood Pressure Father     Heart Disease Father     High cholesterol Father      Social History     Tobacco Use    Smoking status: Former Smoker     Types: Cigarettes     Last attempt to quit: 12/13/2017     Years since quitting: 1.1    Smokeless tobacco: Never Used   Substance Use Topics    Alcohol use: Yes     Allergies   Allergen Reactions    Bactrim [Sulfamethoxazole-Trimethoprim] Hives    Doxycycline Rash     Possible ID reaction with staph infection.    Polymyxin B Other (See Comments)     Reaction not specified       Current Outpatient Medications   Medication    semaglutide (RYBELSUS) 3 MG tablet    fluticasone furoate-vilanterol (BREO ELLIPTA) 100-25 mcg/inhalation inhaler    omeprazole (PRILOSEC) 20 MG capsule    losartan (COZAAR) 100 MG tablet    mupirocin (BACTROBAN) 2 % ointment  umeclidinium bromide (INCRUSE ELLIPTA) 62.5 MCG/INH AEPB    metFORMIN (GLUCOPHAGE) 500 MG tablet    VENTOLIN HFA 108 (90 BASE) MCG/ACT inhaler     No current facility-administered medications for this visit.        Objective:     Physical Exam:     General appearance: alert, appears stated age, cooperative and no distress    Head: Normocephalic, without obvious abnormality, atraumatic  Lungs: respirations are regular and unlabored    Extremities: Bilateral lower extremities are warm and well perfused without any evidence of clubbing, cyanosis, or limb ischemia. Intact DP and PT pulses. No evidence of venous ulceration, lipodermatosclerosis, or edema.  Left medial calf catheter access site is well healed without paresthesia.     Neurologic: Grossly normal    Vitals:     Vitals:    02/22/19 1318   BP: 152/80   Weight: 119.7 kg (264 lb)    Height: 1.93 m (6\' 4" )     CEAP/VCSS:    RIGHT Leg CEAP/VCSS:  Class: C2  Etiology: Primary    Pain: None  Varicose Veins: Moderate (multiple, confined to calf or thigh)  Edema: None  Pigmentation: None  Inflammation: None  Induration: None  Active Ulcers: None  Ulcer Duration: None  Largest Diameter: None  Compression Rx: Everyday  Total: 5    LEFT Leg CEAP/VCSS:  Class: C2  Etiology: Primary    Pain: None  Varicose Veins: Moderate (multiple, confined to calf or thigh)  Edema: None  Pigmentation: None  Inflammation: None  Induration: None  Active Ulcers: None  Ulcer Duration: None  Largest Diameter: None  Compression Rx: Everyday  Total: 5    Non-Invasive Studies:  Successful closure of the left great saphenous vein is seen by wall thickening and cessation of flow 3.5 cm from the common femoral vein.  No evidence of deep venous thrombosis in the left lower extremity.  Patent superficial epigastric vein.    Assessment:     S/p left lower extremity radiofrequency ablation. Stable follow-up visit.    Plan:     Kavian been doing well since left GSV RFA and has experienced improvement in prior symptoms of venous reflux. Herbie Baltimore will be returning to clinic in the next few weeks for RFA of the right GSV.       Artist Pais, NP 02/22/2019

## 2019-03-09 IMAGING — CT CT HEAD W/O CM
3 series · 15 of 47 positions shown, 18 images · non-contrast
Comparison: None.

CLINICAL DATA: Fatigue and dizziness

EXAM:
CT HEAD WITHOUT CONTRAST
TECHNIQUE: Contiguous axial images were obtained from the base of the skull
through the vertex without intravenous contrast.

[Series 2: head wo · axial · 0.46mm/px · z∈[-190,-45]mm · 9 of 35 slices shown, 12 images]
[im 3/35  brain]
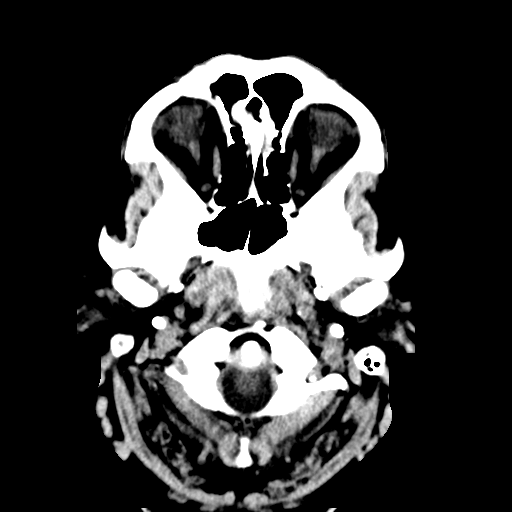
[im 3/35  bone]
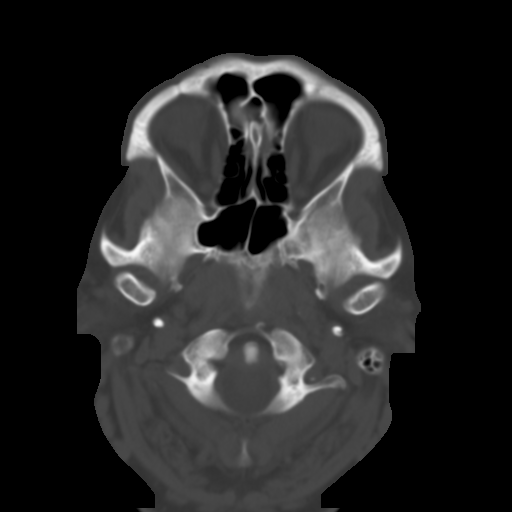
[im 6/35  brain]
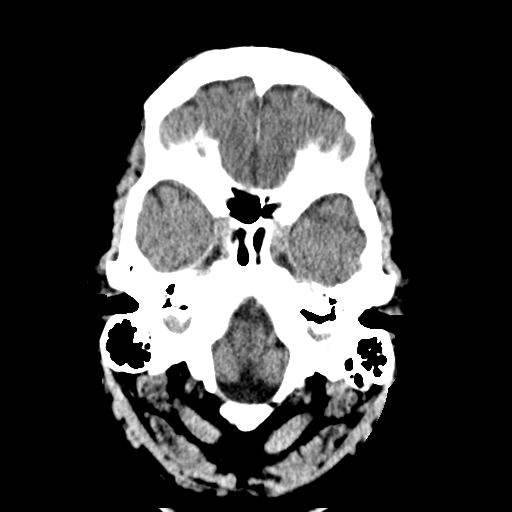
[im 10/35  brain]
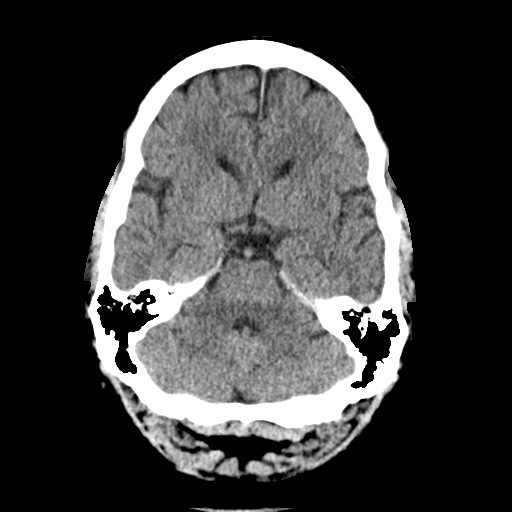
[im 13/35  brain]
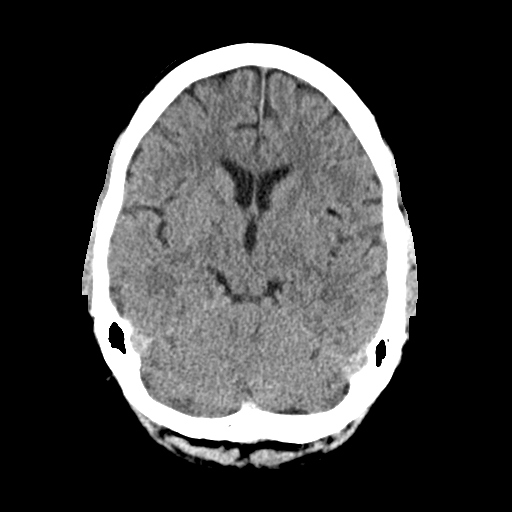
[im 18/35  brain]
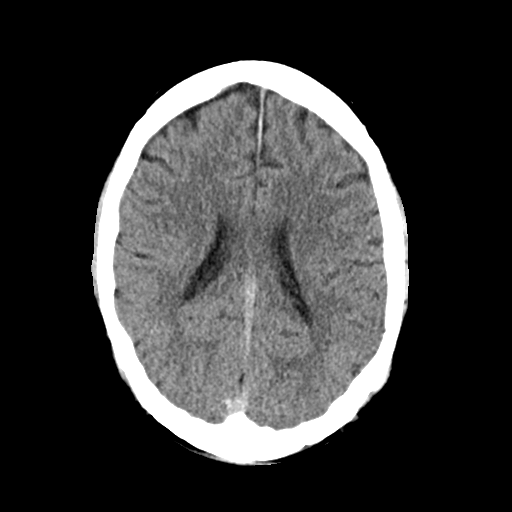
[im 18/35  bone]
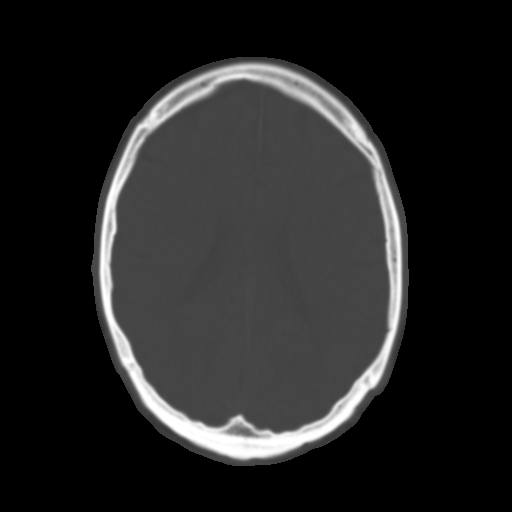
[im 22/35  brain]
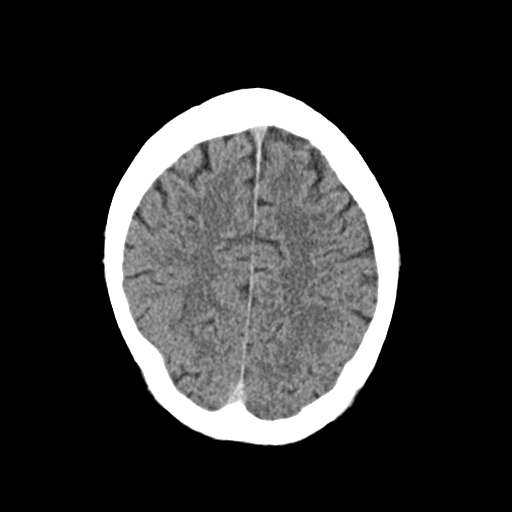
[im 25/35  brain]
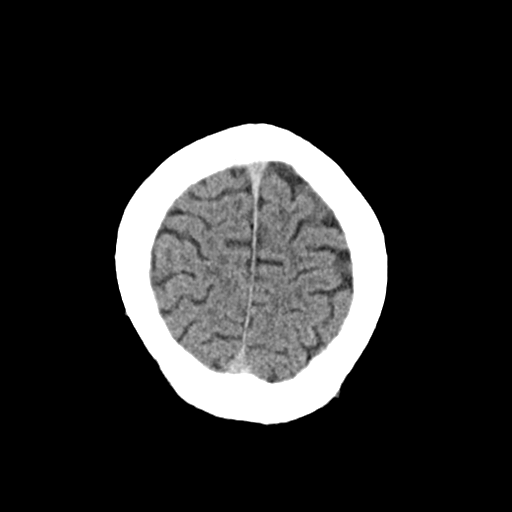
[im 29/35  brain]
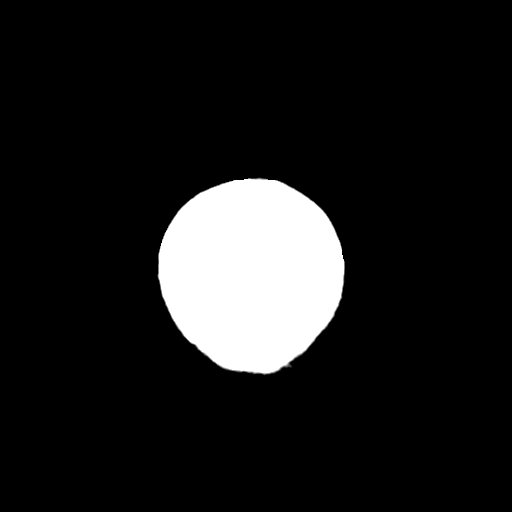
[im 32/35  brain]
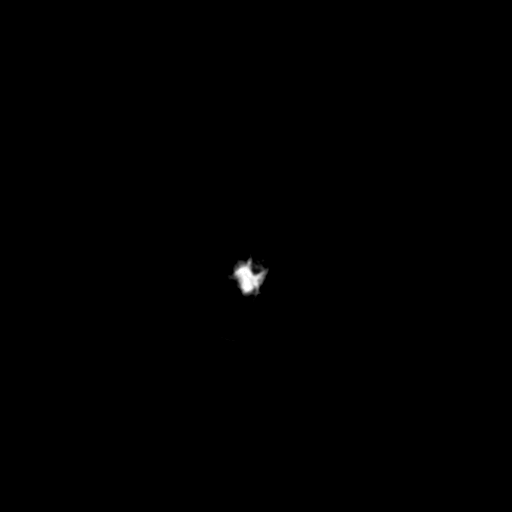
[im 32/35  bone]
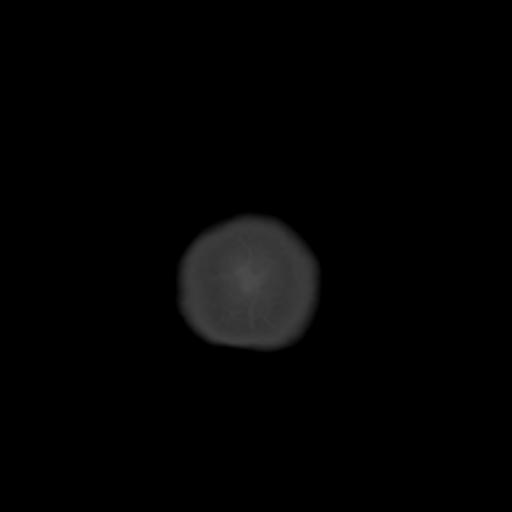

[Series 4: coronal soft · coronal · 0.33mm/px · 3 of 76 slices shown]
[im 26/76  brain]
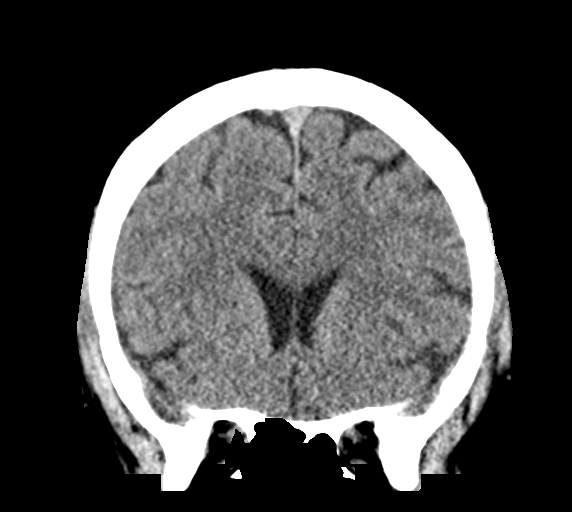
[im 34/76  brain]
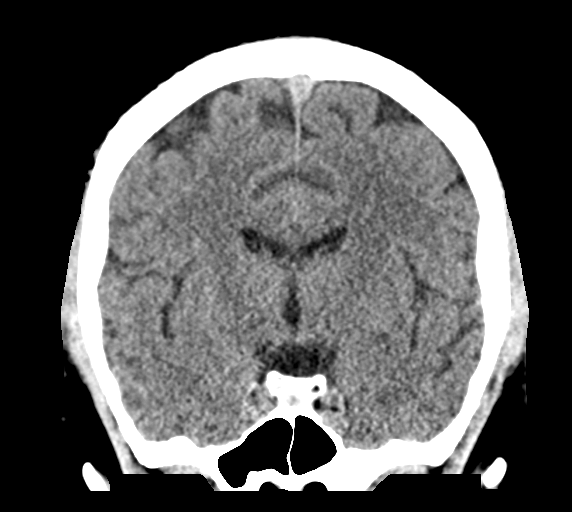
[im 42/76  brain]
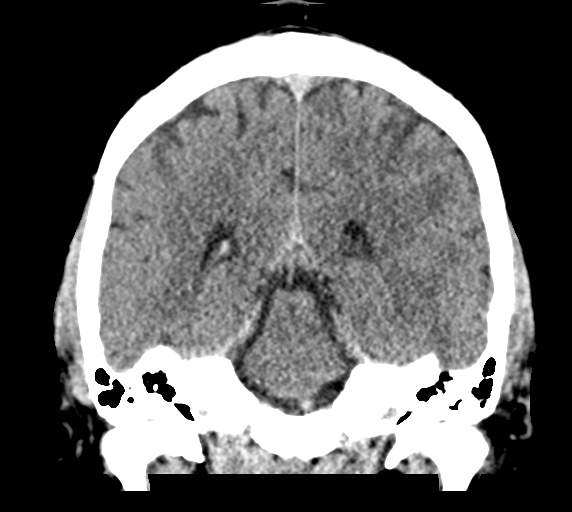

[Series 5: sag soft · sagittal · 0.34mm/px · 3 of 60 slices shown]
[im 20/60  brain]
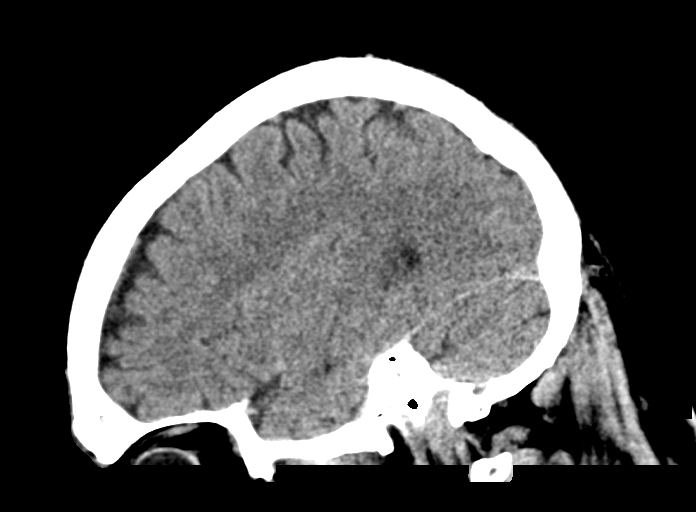
[im 30/60  brain]
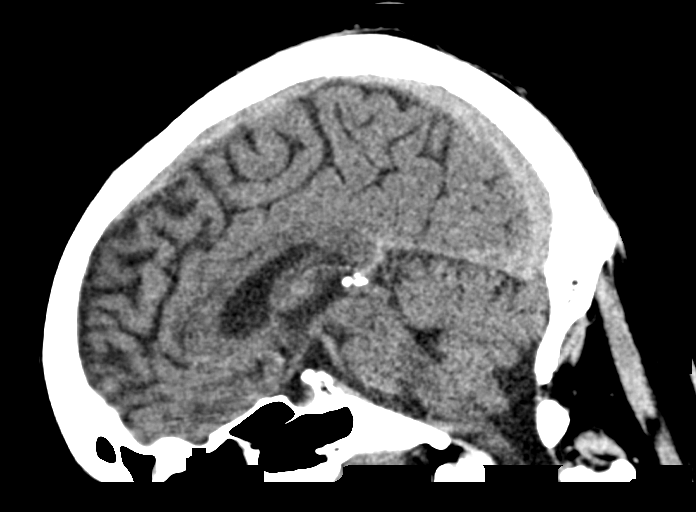
[im 40/60  brain]
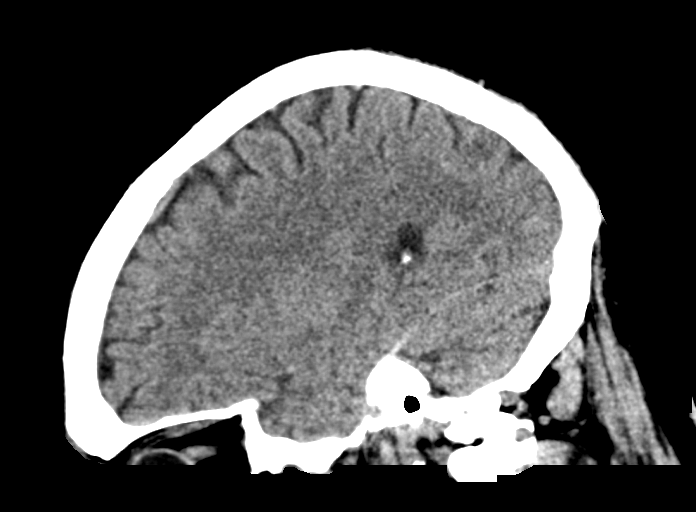

[15 of 47 positions shown; findings below may reference images not displayed]

FINDINGS: Brain: No mass lesion, intraparenchymal hemorrhage or extra-axial
collection. No evidence of acute cortical infarct. Normal appearance
of the brain parenchyma and extra axial spaces for age.

Vascular: No hyperdense vessel or unexpected vascular calcification.

Skull: Normal visualized skull base, calvarium and extracranial soft
tissues.

Sinuses/Orbits: No sinus fluid levels or advanced mucosal
thickening. No mastoid effusion. Normal orbits.
IMPRESSION: Normal head CT.

## 2019-03-10 ENCOUNTER — Ambulatory Visit: Payer: PRIVATE HEALTH INSURANCE | Attending: Vascular Surgery | Admitting: Vascular Surgery

## 2019-03-10 ENCOUNTER — Encounter: Payer: Self-pay | Admitting: Vascular Surgery

## 2019-03-10 VITALS — BP 134/96 | Temp 98.3°F | Ht 76.0 in | Wt 264.6 lb

## 2019-03-10 DIAGNOSIS — I83813 Varicose veins of bilateral lower extremities with pain: Secondary | ICD-10-CM | POA: Insufficient documentation

## 2019-03-10 MED ORDER — ENOXAPARIN SODIUM 40 MG/0.4ML IJ SOSY *I*
40.0000 mg | PREFILLED_SYRINGE | Freq: Once | INTRAMUSCULAR | Status: AC
Start: 2019-03-10 — End: 2019-03-10
  Administered 2019-03-10: 40 mg via SUBCUTANEOUS

## 2019-03-10 MED ORDER — LIDOCAINE HCL 1 % IJ SOLN *I*
10.0000 mL | Freq: Once | INTRAMUSCULAR | Status: AC
Start: 2019-03-10 — End: 2019-03-10
  Administered 2019-03-10: 10 mL via SUBCUTANEOUS

## 2019-03-10 MED ORDER — TUMESCENT ANESTHESIA SOLUTION *I*
1000.0000 mL | Freq: Once | INTRAVENOUS | Status: AC
Start: 2019-03-10 — End: 2019-03-10
  Administered 2019-03-10: 1000 mL via SUBCUTANEOUS

## 2019-03-10 NOTE — Progress Notes (Signed)
Surgeon: Glory Rosebush, MD  Assistant:   Surgery Date: 03/10/2019  Location: [facility]    Preoperative Diagnosis: Symptomatic reflux of BILATERAL leg(I83.893).    Postoperative Diagnosis: Symptomatic reflux of BILATERAL leg(I83.893).    Procedure: RFA of RIGHT great saphenous vein           Procedure start time:11:31           Procedure end time:11:53      Anesthesia: Tumescent: 475ml             Local: Lidocaine 1%:69ml             Additional Anesthesia: None      Indications: Kevin Baldwin is a 53 y.o. male who presented with signs and symptoms of bilateral lower extremity venous insufficiency. Venous competency duplex confirmed the diagnosis.  Symptoms of pain, swelling, and fatigue failed to improve despite conservative measures, therefore further treatment was indicated. The patient was informed of the potential risks, complications, benefits and alternatives to the procedure including but not limited to bleeding, infection, DVT, and continued medical management. The patient acknowledged understanding and informed consent was obtained.    Procedure: The patient was placed on the procedure table in a supine position.  The right lower extremity was prepped and draped in the usual sterile fashion.  A 2 patient identifier time out was performed. Under direct ultrasound guidance, the right great saphenous vein was accessed with an arterial entry needle at the level of the calf.  A wire was advanced without difficulty.  A small incision was made and the needle was exchanged out for a 14F sheath. The radiofrequency ablation probe was advanced to the level of the sapheno-femoral  junction and was placed 4.1 cm from the epigastric. Tumescent solution was injected along the length of the vein under ultrasound guidance. The probe was activated and the vein was ablated. Sterile dressing was placed with Steri-strips, guaze and tegederm. The patient tolerated the procedure well. Post-procedure ultrasound  documented patent common femoral and epigastric veins.    All sponge and instrument counts were correct at the end of the case.  I, Mabel Unrein, MD , was present for the entirety of the procedure.    Vascular Quality Initiative - Varicose Vein Treatment    Does this vein treatment meet VQI inclusion requirements?      Mark YES below f this was a percutaneous (closed) or cut-down (open) procedure to ablate or remove superficial truncal veins, perforating veins, or varicose vein clusters in the lower extremity(ies) for treatment of C2 or greater venous disease.    Mark NO" below if the treatment was performed for deep veins of the lower extremity, for trauma, or for C0 or C1 venous disease.  Also mark "NO" if treatment was performed for Klippel-Trenaunay Syndrome or other venous malformations.  .   Yes     Setting: hospital, outpatient  Anesthesia: local and tumescent: 450 ml  Peri-procedural anticoagulation: LMWH  _____________________________________________    Vein Number:  1    Side: right  Location: Truncal: GSV thigh and calf  Largest Diameter: 10.8 mm  Ablation Treatment: Thermal, RF. Covidien- Closurefast (Length of vein treated: 77 cm.  Tip Length: 7 cm.  Number of cycles: 11. Total time: 220 seconds.)     Postop    Compression Therapy: Stocking.  Duration prescribed: seven days. Strength: 20-30 mmHg.   Complications: none

## 2019-03-10 NOTE — Patient Instructions (Signed)
[facility]  [address]  [place], [place]  [address]  [phone]    After procedure instructions - Saphenous vein closure procedure      Activity:  You may return to normal activities the day of your procedure.  Strenuous exercise can be restarted the next day.  Walking is recommended, bed rest is not.  No tub bathing, hot tubs or swimming for 24 hours.  Showers are fine.   A driver to and from your procedure is mandatory.      Travel:  Car trips (>3-4 hrs) and/or air flights are not recommended for 7-10 days post procedure.    Dressings/Bandages/Stockings:  Please wear your stocking for the first 24 hours.  The first night is the only night you will need to wear them to bed.  You may remove them and shower the next day.  After the first 24 hours, your stocking should be worn during day-light hours (7am-7pm) for 7 days.  Stockings should be worn consistently throughout the first week following your procedure to help maintain the vein closure and minimize swelling/bruising.  • The clear dressing & gauze pad can be removed in 2 days.  Any band aids can be removed the next day when you shower.  • Expect to see tiny droplets of the local anesthetic on your leg.  It could moisten your leg/stocking(s) and make it feel damp over the course of 24 hours, this is normal.  You may want to bring/wear comfortable and loose fitting pants on your procedure day.    Pain control:  Ibuprofen is recommended if needed for pain relief for the first 24-48 hours.  Most patients experience no significant pain post-operatively.    Important notes:  After your procedure we will make an appointment with in 5-7 days for a follow-up ultrasound at our office.  3 months after your procedure you will follow-up with the doctor, nurse practitioner, or physician assistant for an office visit.    Problems:  Please contact the office at [phone], if you have any questions or problems.      Additional information  regarding this procedure can be found on the web at [email or web address]

## 2019-03-15 ENCOUNTER — Ambulatory Visit: Payer: 59 | Admitting: Vascular Surgery

## 2019-03-15 ENCOUNTER — Ambulatory Visit
Admission: RE | Admit: 2019-03-15 | Discharge: 2019-03-15 | Disposition: A | Discharge status: Home / Self Care | Payer: 59 | Source: Ambulatory Visit | Attending: Vascular Surgery | Admitting: Vascular Surgery

## 2019-03-15 ENCOUNTER — Encounter: Payer: Self-pay | Admitting: Vascular Surgery

## 2019-03-15 VITALS — BP 150/96 | Wt 264.0 lb

## 2019-03-15 DIAGNOSIS — I83813 Varicose veins of bilateral lower extremities with pain: Secondary | ICD-10-CM

## 2019-03-15 DIAGNOSIS — I83891 Varicose veins of right lower extremities with other complications: Secondary | ICD-10-CM | POA: Insufficient documentation

## 2019-03-15 DIAGNOSIS — Z48812 Encounter for surgical aftercare following surgery on the circulatory system: Secondary | ICD-10-CM | POA: Insufficient documentation

## 2019-03-15 NOTE — Progress Notes (Signed)
I had the pleasure of seeing Kevin Baldwin in the [facility] today. [name] is a pleasant 53 y.o. male with a history of symptomatic venous reflux involving the bilateral lower extremities. On 03/10/2019, Derold underwent radiofrequency ablation of the right great saphenous vein, which was performed by Dr. Glory Rosebush. Abdirizak presents today for routine follow-up.    Subjective:     Since the procedure, [name] reports experiencing much improvement in prior symptoms of venous reflux. He denies any new leg swelling or pain; there is mild numbness overlying access site without pain. He reports consistent wear of thigh high compression as previously recommended. Overall, [name] is pleased with the outcome of the procedure.    [name] denies any changes to health history, medications, or drug allergies since the last visit.    RIGHT leg complications from procedure: none  LEFT leg complications from procedure: none  Treated vein recanalyzed?: No  Hospital admission within 72 hours after outpatient procedure?: no  Current living status:  home  Current anticoagulant: none    Patient Reported Symptoms:    RIGHT leg:  Heaviness: None of the time  Achiness: None of the time  Swelling: None of the time  Throbbing: None of the time  Itching: A little of the time  Vein appearance: Not at all noticeable  Impact of RIGHT leg symptoms on ability to work/perform usual daily activities over the past week: No effect    LEFT leg:  Heaviness: None of the time  Achiness: None of the time  Swelling: None of the time  Throbbing: None of the time  Itching: None of the time  Vein appearance: Not at all noticeable  Impact of LEFT leg symptoms on ability to work/perform usual daily activities over the past week: Symptoms but full work/activity    Number of days after procedure before patient could resume work/normal activity: 1    Review of Systems    Pertinent items are noted in HPI.    Past Medical History:   Diagnosis Date     Asthma without status asthmaticus 06/11/2016    Celiac disease 06/11/2016    Chronic obstructive lung disease 06/11/2016    Hypertension 12/09/2017    Pure hypercholesterolemia 06/11/2016    Tobacco user 06/11/2016    Type 2 diabetes mellitus 12/09/2017      Past Surgical History:   Procedure Laterality Date    VARICOSE VEIN SURGERY Bilateral 2006      Family History   Problem Relation Age of Onset    Diabetes Mother     High Blood Pressure Father     Heart Disease Father     High cholesterol Father      Social History     Tobacco Use    Smoking status: Former Smoker     Types: Cigarettes     Last attempt to quit: 12/13/2017     Years since quitting: 1.2    Smokeless tobacco: Never Used   Substance Use Topics    Alcohol use: Yes     Allergies   Allergen Reactions    Bactrim [Sulfamethoxazole-Trimethoprim] Hives    Doxycycline Rash     Possible ID reaction with staph infection.    Polymyxin B Other (See Comments)     Reaction not specified       Current Outpatient Medications   Medication    semaglutide (RYBELSUS) 3 MG tablet    losartan (COZAAR) 100 MG tablet    mupirocin (BACTROBAN) 2 %  ointment    umeclidinium bromide (INCRUSE ELLIPTA) 62.5 MCG/INH AEPB    fluticasone furoate-vilanterol (BREO ELLIPTA) 100-25 mcg/inhalation inhaler    metFORMIN (GLUCOPHAGE) 500 MG tablet    omeprazole (PRILOSEC) 20 MG capsule    VENTOLIN HFA 108 (90 BASE) MCG/ACT inhaler     No current facility-administered medications for this visit.        Objective:     Physical Exam:   BP (!) 150/96    Wt 119.7 kg (264 lb)    BMI 32.14 kg/m      General appearance: alert, appears stated age, cooperative and no distress  Head: Normocephalic, atraumatic  Lungs: respirations unlabored  Heart: regular rate and rhythm  Extremities: Bilateral lower extremities are warm and well perfused without any evidence of clubbing, cyanosis, or limb ischemia. Intact DP and PT pulses. No evidence of venous ulceration, lipodermatosclerosis,  or edema. Right medial calf catheter access site is well healed.   Neurologic: Grossly normal      Pulses:  Right Dorsalis Pedis: Normal  Left Dorsalis Pedis: Normal  Right Posterior Tibial: Normal  Left Posterior Tibial: Normal       CEAP/VCSS:    RIGHT Leg CEAP/VCSS:  Class: C4a  Etiology: Primary  Anatomy: Superficial  Pathophysiology: Reflux    Pain: Mild (occasional, non-restrictive)  Varicose Veins: Severe (multiple, involve both calf and thigh)  Edema: None  Pigmentation: Mild (limited to perimalleolar area)  Inflammation: None  Induration: None  Active Ulcers: None  Ulcer Duration: None  Largest Diameter: None  Compression Rx: Everyday  Total: 8    LEFT Leg CEAP/VCSS:  Class: C4a  Etiology: Primary  Anatomy: Superficial  Pathophysiology: Reflux    Pain: Mild (occasional, non-restrictive)  Varicose Veins: Severe (multiple, involve both calf and thigh)  Edema: None  Pigmentation: Mild (limited to perimalleolar area)  Inflammation: None  Induration: None  Active Ulcers: None  Ulcer Duration: None  Largest Diameter: None  Compression Rx: Everyday  Total: 8    Non-Invasive Studies:  Successful closure of the right great saphenous vein is seen by wall thickening and cessation of flow 1.5 cm from the common femoral vein.    No evidence of deep venous thrombosis in the right lower extremity.  Superficial thrombophlebitis noted in branch of the great saphenous vein at mid thigh and mid calf.    Patent superficial epigastric vein.    Assessment:     S/p right lower extremity radiofrequency ablation. Stable follow-up visit.    Plan:     [name] been doing well since right GSV RFA and has experienced improvement in prior symptoms of venous reflux. We discussed continued wear of thigh high compression through 1 week post-procedure at minimum and ongoing travel restriction. [name] is scheduled for further vein treatment in the coming weeks. All questions regarding planned procedure answered, informed consent was  previously obtained. [name] was urged to phone the office with any questions/concerns prior to next visit.    Thank you for the opportunity to participate in Deboraha SprangRobert J Venturella's care. If we can be of any further assistance to you, please feel free to contact our office.    Azzie RoupElizabeth A Wight-Regna, NP 03/15/2019

## 2019-03-24 ENCOUNTER — Ambulatory Visit: Payer: PRIVATE HEALTH INSURANCE | Attending: Vascular Surgery | Admitting: Vascular Surgery

## 2019-03-24 ENCOUNTER — Encounter: Payer: Self-pay | Admitting: Vascular Surgery

## 2019-03-24 VITALS — BP 128/86 | Temp 98.1°F | Ht 76.0 in | Wt 264.6 lb

## 2019-03-24 DIAGNOSIS — I83813 Varicose veins of bilateral lower extremities with pain: Secondary | ICD-10-CM | POA: Insufficient documentation

## 2019-03-24 MED ORDER — TUMESCENT ANESTHESIA SOLUTION *I*
1000.0000 mL | Freq: Once | INTRAVENOUS | Status: AC
Start: 2019-03-24 — End: 2019-03-24
  Administered 2019-03-24: 1000 mL via SUBCUTANEOUS

## 2019-03-24 MED ORDER — LIDOCAINE HCL 1 % IJ SOLN *I*
10.0000 mL | Freq: Once | INTRAMUSCULAR | Status: AC
Start: 2019-03-24 — End: 2019-03-24
  Administered 2019-03-24: 10 mL via SUBCUTANEOUS

## 2019-03-24 NOTE — Patient Instructions (Signed)
[facility]  [address], [address]  [place], [place]  [address]  [phone]    After procedure instructions - Saphenous vein closure procedure      Activity:  You may return to normal activities the day of your procedure.  Strenuous exercise can be restarted the next day.  Walking is recommended, bed rest is not.  No tub bathing, hot tubs or swimming for 24 hours.  Showers are fine.   A driver to and from your procedure is mandatory.      Travel:  Car trips (>3-4 hrs) and/or air flights are not recommended for 7-10 days post procedure.    Dressings/Bandages/Stockings:  Please wear your stocking for the first 24 hours.  The first night is the only night you will need to wear them to bed.  You may remove them and shower the next day.  After the first 24 hours, your stocking should be worn during day-light hours (7am-7pm) for 7 days.  Stockings should be worn consistently throughout the first week following your procedure to help maintain the vein closure and minimize swelling/bruising.  • The clear dressing & gauze pad can be removed in 2 days.  Any band aids can be removed the next day when you shower.  • Expect to see tiny droplets of the local anesthetic on your leg.  It could moisten your leg/stocking(s) and make it feel damp over the course of 24 hours, this is normal.  You may want to bring/wear comfortable and loose fitting pants on your procedure day.    Pain control:  Ibuprofen is recommended if needed for pain relief for the first 24-48 hours.  Most patients experience no significant pain post-operatively.    Important notes:  After your procedure we will make an appointment with in 5-7 days for a follow-up ultrasound at our office.  3 months after your procedure you will follow-up with the doctor, nurse practitioner, or physician assistant for an office visit.    Problems:  Please contact the office at [phone], if you have any questions or problems.      Additional information  regarding this procedure can be found on the web at [email or web address]

## 2019-03-24 NOTE — Progress Notes (Signed)
Surgeon: Glory Rosebush, MD  Assistant:   Surgery Date: 03/24/2019  [facility]    Preoperative Diagnosis: Symptomatic reflux of BILATERAL leg(I83.893).    Postoperative Diagnosis: Symptomatic reflux of BILATERAL leg(I83.893).    Procedure: RFA of LEFT anterior saphenous vein           Procedure start time:11:08           Procedure end time:11:17      Anesthesia: Tumescent: 228ml             Local: Lidocaine 1%:6ml             Additional Anesthesia: None      Indications: Kevin Baldwin is a 53 y.o. male who presented with signs and symptoms of bilateral lower extremity venous insufficiency. Venous competency duplex confirmed the diagnosis.  His symptoms of pain, swelling, and fatigue failed to improve despite conservative measures, therefore further treatment was indicated. The patient was informed of the potential risks, complications, benefits and alternatives to the procedure including but not limited to bleeding, infection, DVT, and continued medical management. The patient acknowledged understanding and informed consent was obtained.    Procedure: The patient was placed on the procedure table in a supine position.  The left lower extremity was prepped and draped in the usual sterile fashion.  A 2 patient identifier time out was performed. Under direct ultrasound guidance, the left anterior acessory saphenous vein was accessed with an arterial entry needle at the level of the thigh.  A wire was advanced without difficulty.  A small incision was made and the needle was exchanged out for a 56F sheath. The radiofrequency ablation probe was advanced to the level of the sapheno-femoral  junction and was placed 4.2 cm from the epigastric. Tumescent solution was injected along the length of the vein under ultrasound guidance. The probe was activated and the vein was ablated. Sterile dressing was placed with Steri-strips, guaze and tegederm. The patient tolerated the procedure well. Post-procedure  ultrasound documented patent common femoral and epigastric veins.    All sponge and instrument counts were correct at the end of the case.  I, Azaliyah Kennard, MD , was present for the entirety of the procedure.    Vascular Quality Initiative - Varicose Vein Treatment    Does this vein treatment meet VQI inclusion requirements?      Mark YES below f this was a percutaneous (closed) or cut-down (open) procedure to ablate or remove superficial truncal veins, perforating veins, or varicose vein clusters in the lower extremity(ies) for treatment of C2 or greater venous disease.    Mark NO" below if the treatment was performed for deep veins of the lower extremity, for trauma, or for C0 or C1 venous disease.  Also mark "NO" if treatment was performed for Klippel-Trenaunay Syndrome or other venous malformations.  .   Yes     Setting: hospital, outpatient  Anesthesia: local and tumescent: 200 ml  Peri-procedural anticoagulation: none  _____________________________________________    Vein Number:  1    Side: left  Location: Truncal: AAGSV thigh  Largest Diameter: 5.8 mm  Ablation Treatment: Thermal, RF. Covidien- Closurefast (Length of vein treated: 28 cm.  Tip Length: 7 cm.  Number of cycles: 4. Total time: 80 seconds.)     Postop    Compression Therapy: Stocking.  Duration prescribed: seven days. Strength: 20-30 mmHg.   Complications: none

## 2019-03-30 ENCOUNTER — Ambulatory Visit
Admission: RE | Admit: 2019-03-30 | Discharge: 2019-03-30 | Disposition: A | Discharge status: Home / Self Care | Payer: 59 | Source: Ambulatory Visit | Attending: Vascular Surgery | Admitting: Vascular Surgery

## 2019-03-30 ENCOUNTER — Ambulatory Visit: Payer: 59 | Admitting: Vascular Surgery

## 2019-03-30 VITALS — BP 152/90 | Ht 76.0 in | Wt 264.0 lb

## 2019-03-30 DIAGNOSIS — I83893 Varicose veins of bilateral lower extremities with other complications: Secondary | ICD-10-CM

## 2019-03-30 DIAGNOSIS — I83892 Varicose veins of left lower extremities with other complications: Secondary | ICD-10-CM | POA: Insufficient documentation

## 2019-03-30 DIAGNOSIS — I83813 Varicose veins of bilateral lower extremities with pain: Secondary | ICD-10-CM

## 2019-03-30 NOTE — Progress Notes (Signed)
I had the pleasure of seeing Kevin Baldwin in the [facility] today. Kevin Baldwin is a pleasant 53 y.o. male with a history of symptomatic venous reflux involving the bilateral lower extremities. On 03/24/2019, Kevin Baldwin underwent radiofrequency ablation of the left anterior saphenous vein, which was performed by Dr. Glory Rosebush. Kevin Baldwin presents today for routine follow-up.    Subjective:     Since the procedure, Kevin Baldwin reports experiencing much improvement in prior symptoms of venous reflux. He denies any new leg swelling or pain. He reports consistent wear of thigh high compression as previously recommended. Overall, Kevin Baldwin is pleased with the outcome of the procedure.    Kevin Baldwin denies any changes to health history, medications, or drug allergies since the last visit.    RIGHT leg complications from procedure: none  LEFT leg complications from procedure: none  Treated vein recanalyzed?: No  Hospital admission within 72 hours after outpatient procedure?: no  Current living status:  home  Current anticoagulant: none    Patient Reported Symptoms:  RIGHT leg:  Heaviness: None of the time  Achiness: None of the time  Swelling: None of the time  Throbbing: None of the time  Itching: None of the time  Vein appearance: Slightly noticeable  Impact of RIGHT leg symptoms on ability to work/perform usual daily activities over the past week: No effect    LEFT leg:  Heaviness: None of the time  Achiness: None of the time  Swelling: None of the time  Throbbing: None of the time  Itching: None of the time  Vein appearance: Moderately noticeable  Impact of LEFT leg symptoms on ability to work/perform usual daily activities over the past week: No effect    Number of days after procedure before patient could resume work/normal activity: 1    Review of Systems    Pertinent items are noted in HPI.    Past Medical History:   Diagnosis Date    Asthma without status asthmaticus 06/11/2016    Celiac disease 06/11/2016    Chronic  obstructive lung disease 06/11/2016    Hypertension 12/09/2017    Pure hypercholesterolemia 06/11/2016    Tobacco user 06/11/2016    Type 2 diabetes mellitus 12/09/2017      Past Surgical History:   Procedure Laterality Date    VARICOSE VEIN SURGERY Bilateral 2006      Family History   Problem Relation Age of Onset    Diabetes Mother     High Blood Pressure Father     Heart Disease Father     High cholesterol Father      Social History     Tobacco Use    Smoking status: Former Smoker     Types: Cigarettes     Last attempt to quit: 12/13/2017     Years since quitting: 1.2    Smokeless tobacco: Never Used   Substance Use Topics    Alcohol use: Yes     Allergies   Allergen Reactions    Bactrim [Sulfamethoxazole-Trimethoprim] Hives    Doxycycline Rash     Possible ID reaction with staph infection.    Polymyxin B Other (See Comments)     Reaction not specified       Current Outpatient Medications   Medication    semaglutide (RYBELSUS) 3 MG tablet    mupirocin (BACTROBAN) 2 % ointment    fluticasone furoate-vilanterol (BREO ELLIPTA) 100-25 mcg/inhalation inhaler    metFORMIN (GLUCOPHAGE) 500 MG tablet    omeprazole (PRILOSEC) 20 MG  capsule    VENTOLIN HFA 108 (90 BASE) MCG/ACT inhaler    losartan (COZAAR) 100 MG tablet    umeclidinium bromide (INCRUSE ELLIPTA) 62.5 MCG/INH AEPB     No current facility-administered medications for this visit.        Objective:     Physical Exam:   BP 152/90 (BP Location: Right arm)    Ht 1.93 m (6\' 4" )    Wt 119.7 kg (264 lb)    BMI 32.14 kg/m      General appearance: alert, appears stated age, cooperative and no distress  Head: Normocephalic, atraumatic  Lungs: respirations unlabored  Heart: regular rate and rhythm  Extremities: Bilateral lower extremities are warm and well perfused without any evidence of clubbing, cyanosis, or limb ischemia. Intact DP and PT pulses. No evidence of venous ulceration, lipodermatosclerosis, or edema. Left thigh catheter access site is  well healed.   Neurologic: Grossly normal    Pulses:  Right Dorsalis Pedis: Normal  Left Dorsalis Pedis: Normal  Right Posterior Tibial: Normal  Left Posterior Tibial: Normal     CEAP/VCSS:  RIGHT Leg CEAP/VCSS:  Class: C4a  Etiology: Primary  Anatomy: Superficial  Pathophysiology: Reflux    Pain: Mild (occasional, non-restrictive)  Varicose Veins: Severe (multiple, involve both calf and thigh)  Edema: None  Pigmentation: Mild (limited to perimalleolar area)  Inflammation: None  Induration: None  Active Ulcers: None  Ulcer Duration: None  Largest Diameter: None  Compression Rx: Everyday  Total: 8    LEFT Leg CEAP/VCSS:  Class: C4a  Etiology: Primary  Anatomy: Superficial  Pathophysiology: Reflux    Pain: Mild (occasional, non-restrictive)  Varicose Veins: Severe (multiple, involve both calf and thigh)  Edema: None  Pigmentation: Mild (limited to perimalleolar area)  Inflammation: None  Induration: None  Active Ulcers: None  Ulcer Duration: None  Largest Diameter: None  Compression Rx: Everyday  Total: 8    Non-Invasive Studies:  Interpretation Summary   History: Post op Lt ASV RFA  Prior: RFA/Comp    Successful closure of the left anterior saphenous vein is seen by wall thickening and cessation of flow 1.01cm from the common femoral vein.  No evidence of deep venous thrombosis in the left lower extremity.  Patent superficial epigastric vein.     Assessment:     S/p left lower extremity radiofrequency ablation. Stable follow-up visit.    Plan:     Kevin Baldwin been doing well since left ASV RFA and has experienced improvement in prior symptoms of venous reflux. We discussed continued wear of thigh high compression through 1 week post-procedure at minimum and ongoing travel restriction. Kevin Baldwin will follow up in 3 months for routine imaging and office visit.  He was encouraged to call with any questions or concerns in the interim.    Thank you for the opportunity to participate in Kevin Baldwin's care. If we can be of  any further assistance to you, please feel free to contact our office.    Ned ClinesNicole Carollee Nussbaumer, NP 03/30/2019

## 2019-07-13 ENCOUNTER — Ambulatory Visit: Payer: PRIVATE HEALTH INSURANCE | Admitting: Vascular Surgery

## 2019-07-13 ENCOUNTER — Ambulatory Visit
Admission: RE | Admit: 2019-07-13 | Discharge: 2019-07-13 | Disposition: A | Discharge status: Home / Self Care | Payer: PRIVATE HEALTH INSURANCE | Source: Ambulatory Visit | Attending: Vascular Surgery | Admitting: Vascular Surgery

## 2019-07-13 ENCOUNTER — Encounter: Payer: Self-pay | Admitting: Vascular Surgery

## 2019-07-13 VITALS — BP 144/82 | Ht 76.0 in | Wt 265.0 lb

## 2019-07-13 DIAGNOSIS — I83893 Varicose veins of bilateral lower extremities with other complications: Secondary | ICD-10-CM | POA: Insufficient documentation

## 2019-07-13 LAB — CV VENOUS VARICOSE COMPETENCY US LOWER EXTREMITY BILATERAL
L SPJ VCT: 0.31 s
L SSV AP Diam Prox: 4.68 mm
L SSV VCT Prox: 0.15 s
Left Common Femoral Vein Closure Time: 2.25 s
Left Femoral Vein Closure Time: 2.14 s
Left Popliteal Vein Closure TIme: 0.88 s
Left Saphenofemoral Junction A-P Diameter: 3.82 mm
Left Saphenofemoral Junction Closure Time: 1.2 s
Left Saphenopopliteal Junction Diameter: 3.49 mm
R SPJ VCT: 0.17 s
R SSV AP Diam Prox: 3.62 mm
R SSV VCT Prox: 0.28 s
Right Common Femoral Vein Closure Time: 1.04 s
Right Femoral Vein Closure Time: 0.93 s
Right Popliteal Vein Closure Time: 0.63 s
Right Saphenofemoral Junction A-P Diameter: 9.3 mm
Right Saphenofemoral Junction Closure Time: 2.93 s
Right Saphenopopliteal Junction Diameter: 2.6 mm

## 2019-07-13 NOTE — Progress Notes (Signed)
I had the pleasure of seeing Kevin Baldwin in the [facility] today. Kevin Baldwin is a pleasant 53 y.o. male with a history of symptomatic venous reflux involving the bilateral lower extremities. On 03/24/2019, Kevin Baldwin underwent radiofrequency ablation of the left anterior saphenous vein, which was performed by Dr. Glory Rosebush.  He also previously underwent RFA of the left great saphenous vein on 02/17/2019 and RFA of the right great saphenous vein on 03/10/2019, both performed by Dr. Vertell Baldwin. Kevin Baldwin presents today for routine follow-up.    Subjective:     Since the procedures, Kevin Baldwin reports experiencing much improvement in prior symptoms of venous reflux. He denies any new leg swelling or pain. Overall, Kevin Baldwin is pleased with the outcome of the procedure.    Kevin Baldwin denies any changes to health history, medications, or drug allergies since the last visit.    RIGHT leg complications from procedure: N/A- this is a long term follow up (> 3 months after procedure)  LEFT leg complications from procedure: N/A- this is a long term follow up (> 3 months after procedure)  Treated vein recanalyzed?: No  Hospital admission within 72 hours after outpatient procedure?: no  Current living status:  home  Current anticoagulant: none    Patient Reported Symptoms:  RIGHT leg:  Heaviness: None of the time  Achiness: None of the time  Swelling: None of the time  Throbbing: None of the time  Itching: A little of the time  Vein appearance: Not at all noticeable  Impact of RIGHT leg symptoms on ability to work/perform usual daily activities over the past week: Symptoms but full work/activity    LEFT leg:  Heaviness: None of the time  Achiness: None of the time  Swelling: None of the time  Throbbing: None of the time  Itching: None of the time  Vein appearance: Not at all noticeable  Impact of LEFT leg symptoms on ability to work/perform usual daily activities over the past week: Symptoms but full work/activity         Review of Systems     Pertinent items are noted in HPI.    Past Medical History:   Diagnosis Date    Asthma without status asthmaticus 06/11/2016    Celiac disease 06/11/2016    Chronic obstructive lung disease 06/11/2016    Hypertension 12/09/2017    Pure hypercholesterolemia 06/11/2016    Tobacco user 06/11/2016    Type 2 diabetes mellitus 12/09/2017      Past Surgical History:   Procedure Laterality Date    VARICOSE VEIN SURGERY Bilateral 2006      Family History   Problem Relation Age of Onset    Diabetes Mother     High Blood Pressure Father     Heart Disease Father     High cholesterol Father      Social History     Tobacco Use    Smoking status: Former Smoker     Types: Cigarettes     Quit date: 12/13/2017     Years since quitting: 1.5    Smokeless tobacco: Never Used   Substance Use Topics    Alcohol use: Yes     Allergies   Allergen Reactions    Bactrim [Sulfamethoxazole-Trimethoprim] Hives    Doxycycline Rash     Possible ID reaction with staph infection.    Polymyxin B Other (See Comments)     Reaction not specified       Current Outpatient Medications   Medication  fluticasone-salmeterol (ADVAIR/WIXELA) 100-50 mcg/dose diskus inhaler    semaglutide (RYBELSUS) 3 MG tablet    omeprazole (PRILOSEC) 20 MG capsule    losartan (COZAAR) 100 MG tablet    mupirocin (BACTROBAN) 2 % ointment    umeclidinium bromide (INCRUSE ELLIPTA) 62.5 MCG/INH AEPB    fluticasone furoate-vilanterol (BREO ELLIPTA) 100-25 mcg/inhalation inhaler    metFORMIN (GLUCOPHAGE) 500 MG tablet    VENTOLIN HFA 108 (90 BASE) MCG/ACT inhaler     No current facility-administered medications for this visit.        Objective:     Physical Exam:   BP 144/82 (BP Location: Right arm)    Ht 1.93 m (6\' 4" )    Wt 120.2 kg (265 lb)    BMI 32.26 kg/m      General appearance: alert, appears stated age, cooperative and no distress  Head: Normocephalic, atraumatic  Lungs: respirations unlabored  Heart: regular rate and rhythm  Extremities: Bilateral  lower extremities are warm and well perfused without any evidence of clubbing, cyanosis, or limb ischemia. Intact DP and PT pulses. No evidence of venous ulceration, lipodermatosclerosis, or edema. Left thigh catheter access site is well healed.   Neurologic: Grossly normal    Pulses:  Right Dorsalis Pedis: Normal  Left Dorsalis Pedis: Normal  Right Posterior Tibial: Normal  Left Posterior Tibial: Normal     CEAP/VCSS:  RIGHT Leg CEAP/VCSS:  Class: C4a  Etiology: Primary  Anatomy: Superficial  Pathophysiology: Reflux    Pain: None  Varicose Veins: Mild (few, scattered)  Edema: None  Pigmentation: Mild (limited to perimalleolar area)  Inflammation: None  Induration: None  Active Ulcers: None  Ulcer Duration: None  Largest Diameter: None  Compression Rx: Intermittent  Total: 3    LEFT Leg CEAP/VCSS:  Class: C4a  Etiology: Primary  Anatomy: Superficial  Pathophysiology: Reflux    Pain: None  Varicose Veins: Mild (few, scattered)  Edema: None  Pigmentation: Mild (limited to perimalleolar area)  Inflammation: None  Induration: None  Active Ulcers: None  Ulcer Duration: None  Largest Diameter: None  Compression Rx: Intermittent  Total: 3    Non-Invasive Studies:  Interpretation Summary  History: Bilateral VV, Bilateral GSV RFA, Lt ASV RFA  Priors: Comp/RFA    Retrograde venous flow greater than 1000 msec demonstrated, indicating venous valvular incompetence in the following deep veins:     Bilateral Common Femoral   Left Femoral   Bilateral SFJ     No evidence of deep venous thrombosis in the bilateral lower extremity.   Bilateral great saphenous remains closed s/p radiofrequency ablation.  Left anterior saphenous vein remains closed s/p radiofrequency ablation.      Assessment:     S/p bilateral lower extremity radiofrequency ablations. Stable follow-up visit.    Plan:     Kevin Baldwin been doing well since bilateral RFAs and has experienced improvement in prior symptoms of venous reflux. Therefore, we will plan on  seeing Kevin Baldwin back in the vein center on an as needed basis. He was encouraged to contact the office with any questions or concerns.     Thank you for the opportunity to participate in Kevin Baldwin care. If we can be of any further assistance to you, please feel free to contact our office.    Deboraha Sprang, NP 07/13/2019
